# Patient Record
Sex: Female | Born: 1970 | Race: Black or African American | Hispanic: No | Smoking: Never smoker
Health system: Southern US, Community
[De-identification: ages and names within clinical notes are randomized; demographics above are authoritative.]

## PROBLEM LIST (undated history)

## (undated) DIAGNOSIS — L409 Psoriasis, unspecified: Secondary | ICD-10-CM

## (undated) DIAGNOSIS — T7840XA Allergy, unspecified, initial encounter: Secondary | ICD-10-CM

## (undated) DIAGNOSIS — O24419 Gestational diabetes mellitus in pregnancy, unspecified control: Secondary | ICD-10-CM

## (undated) HISTORY — DX: Gestational diabetes mellitus in pregnancy, unspecified control: O24.419

## (undated) HISTORY — DX: Psoriasis, unspecified: L40.9

## (undated) HISTORY — DX: Allergy, unspecified, initial encounter: T78.40XA

## (undated) HISTORY — PX: JOINT REPLACEMENT: SHX530

## (undated) HISTORY — PX: KNEE SURGERY: SHX244

---

## 1999-10-22 ENCOUNTER — Other Ambulatory Visit: Admission: RE | Admit: 1999-10-22 | Discharge: 1999-10-22 | Payer: Self-pay | Admitting: *Deleted

## 1999-11-05 ENCOUNTER — Encounter: Admission: RE | Admit: 1999-11-05 | Discharge: 2000-02-03 | Payer: Self-pay | Admitting: Gynecology

## 2000-05-10 ENCOUNTER — Inpatient Hospital Stay (HOSPITAL_COMMUNITY): Admission: AD | Admit: 2000-05-10 | Discharge: 2000-05-12 | Payer: Self-pay | Admitting: *Deleted

## 2000-06-21 ENCOUNTER — Other Ambulatory Visit: Admission: RE | Admit: 2000-06-21 | Discharge: 2000-06-21 | Payer: Self-pay | Admitting: *Deleted

## 2000-07-29 ENCOUNTER — Encounter: Admission: RE | Admit: 2000-07-29 | Discharge: 2000-08-17 | Payer: Self-pay | Admitting: Gynecology

## 2000-08-29 ENCOUNTER — Encounter: Admission: RE | Admit: 2000-08-29 | Discharge: 2000-09-28 | Payer: Self-pay | Admitting: Gynecology

## 2000-09-29 ENCOUNTER — Encounter: Admission: RE | Admit: 2000-09-29 | Discharge: 2000-10-29 | Payer: Self-pay | Admitting: Gynecology

## 2003-08-02 ENCOUNTER — Other Ambulatory Visit: Admission: RE | Admit: 2003-08-02 | Discharge: 2003-08-02 | Payer: Self-pay | Admitting: Gynecology

## 2004-08-05 ENCOUNTER — Other Ambulatory Visit: Admission: RE | Admit: 2004-08-05 | Discharge: 2004-08-05 | Payer: Self-pay | Admitting: Gynecology

## 2005-09-01 ENCOUNTER — Other Ambulatory Visit: Admission: RE | Admit: 2005-09-01 | Discharge: 2005-09-01 | Payer: Self-pay | Admitting: Gynecology

## 2006-03-10 ENCOUNTER — Emergency Department (HOSPITAL_COMMUNITY): Admission: EM | Admit: 2006-03-10 | Discharge: 2006-03-10 | Payer: Self-pay | Admitting: Emergency Medicine

## 2006-11-08 ENCOUNTER — Other Ambulatory Visit: Admission: RE | Admit: 2006-11-08 | Discharge: 2006-11-08 | Payer: Self-pay | Admitting: Gynecology

## 2007-05-19 ENCOUNTER — Encounter: Admission: RE | Admit: 2007-05-19 | Discharge: 2007-05-19 | Payer: Self-pay | Admitting: Family Medicine

## 2007-09-29 ENCOUNTER — Ambulatory Visit (HOSPITAL_BASED_OUTPATIENT_CLINIC_OR_DEPARTMENT_OTHER): Admission: RE | Admit: 2007-09-29 | Discharge: 2007-09-29 | Payer: Self-pay | Admitting: Orthopedic Surgery

## 2008-05-10 ENCOUNTER — Encounter: Admission: RE | Admit: 2008-05-10 | Discharge: 2008-05-10 | Payer: Self-pay | Admitting: Family Medicine

## 2008-05-23 ENCOUNTER — Ambulatory Visit: Payer: Self-pay | Admitting: Women's Health

## 2008-07-15 ENCOUNTER — Ambulatory Visit: Payer: Self-pay | Admitting: Family Medicine

## 2008-07-15 DIAGNOSIS — M1710 Unilateral primary osteoarthritis, unspecified knee: Secondary | ICD-10-CM

## 2008-07-15 DIAGNOSIS — IMO0002 Reserved for concepts with insufficient information to code with codable children: Secondary | ICD-10-CM | POA: Insufficient documentation

## 2008-07-25 ENCOUNTER — Telehealth (INDEPENDENT_AMBULATORY_CARE_PROVIDER_SITE_OTHER): Payer: Self-pay | Admitting: *Deleted

## 2008-07-25 ENCOUNTER — Encounter: Payer: Self-pay | Admitting: Sports Medicine

## 2008-08-13 ENCOUNTER — Ambulatory Visit: Payer: Self-pay | Admitting: Gynecology

## 2008-10-11 ENCOUNTER — Encounter: Payer: Self-pay | Admitting: Women's Health

## 2008-10-11 ENCOUNTER — Other Ambulatory Visit: Admission: RE | Admit: 2008-10-11 | Discharge: 2008-10-11 | Payer: Self-pay | Admitting: Gynecology

## 2008-10-11 ENCOUNTER — Ambulatory Visit: Payer: Self-pay | Admitting: Women's Health

## 2008-11-15 ENCOUNTER — Ambulatory Visit: Payer: Self-pay | Admitting: Women's Health

## 2009-01-01 ENCOUNTER — Inpatient Hospital Stay (HOSPITAL_COMMUNITY): Admission: RE | Admit: 2009-01-01 | Discharge: 2009-01-05 | Payer: Self-pay | Admitting: Orthopedic Surgery

## 2010-04-20 LAB — CARDIAC PANEL(CRET KIN+CKTOT+MB+TROPI)
CK, MB: 0.9 ng/mL (ref 0.3–4.0)
CK, MB: 1.2 ng/mL (ref 0.3–4.0)
Relative Index: 0.1 (ref 0.0–2.5)
Relative Index: 0.1 (ref 0.0–2.5)
Relative Index: 0.1 (ref 0.0–2.5)
Total CK: 1002 U/L — ABNORMAL HIGH (ref 7–177)
Total CK: 1111 U/L — ABNORMAL HIGH (ref 7–177)
Total CK: 718 U/L — ABNORMAL HIGH (ref 7–177)
Total CK: 881 U/L — ABNORMAL HIGH (ref 7–177)
Total CK: 961 U/L — ABNORMAL HIGH (ref 7–177)
Troponin I: 0.01 ng/mL (ref 0.00–0.06)
Troponin I: 0.01 ng/mL (ref 0.00–0.06)
Troponin I: 0.02 ng/mL (ref 0.00–0.06)
Troponin I: 0.02 ng/mL (ref 0.00–0.06)

## 2010-04-20 LAB — BASIC METABOLIC PANEL
BUN: 3 mg/dL — ABNORMAL LOW (ref 6–23)
BUN: 7 mg/dL (ref 6–23)
CO2: 25 mEq/L (ref 19–32)
Calcium: 7.8 mg/dL — ABNORMAL LOW (ref 8.4–10.5)
Calcium: 8.2 mg/dL — ABNORMAL LOW (ref 8.4–10.5)
Chloride: 105 mEq/L (ref 96–112)
Creatinine, Ser: 0.61 mg/dL (ref 0.4–1.2)
Creatinine, Ser: 0.64 mg/dL (ref 0.4–1.2)
Creatinine, Ser: 0.65 mg/dL (ref 0.4–1.2)
Creatinine, Ser: 0.66 mg/dL (ref 0.4–1.2)
GFR calc Af Amer: >60 mL/min (ref >60)
GFR calc Af Amer: >60 mL/min (ref >60)
GFR calc non Af Amer: >60 mL/min (ref >60)
GFR calc non Af Amer: >60 mL/min (ref >60)
GFR calc non Af Amer: >60 mL/min (ref >60)
Glucose, Bld: 97 mg/dL (ref 70–99)
Potassium: 4.3 mEq/L (ref 3.5–5.1)
Sodium: 134 mEq/L — ABNORMAL LOW (ref 135–145)
Sodium: 136 mEq/L (ref 135–145)
Sodium: 136 mEq/L (ref 135–145)
Sodium: 138 mEq/L (ref 135–145)

## 2010-04-20 LAB — CBC
Hemoglobin: 11 g/dL — ABNORMAL LOW (ref 12.0–15.0)
Hemoglobin: 11.2 g/dL — ABNORMAL LOW (ref 12.0–15.0)
MCHC: 34 g/dL (ref 30.0–36.0)
MCHC: 34.6 g/dL (ref 30.0–36.0)
MCHC: 35 g/dL (ref 30.0–36.0)
MCV: 90.4 fL (ref 78.0–100.0)
MCV: 91.8 fL (ref 78.0–100.0)
Platelets: 320 10*3/uL (ref 150–400)
RBC: 3.4 MIL/uL — ABNORMAL LOW (ref 3.87–5.11)
RBC: 3.48 MIL/uL — ABNORMAL LOW (ref 3.87–5.11)
RBC: 3.59 MIL/uL — ABNORMAL LOW (ref 3.87–5.11)
RDW: 12.8 % (ref 11.5–15.5)
WBC: 10.6 10*3/uL — ABNORMAL HIGH (ref 4.0–10.5)
WBC: 9.9 10*3/uL (ref 4.0–10.5)

## 2010-04-20 LAB — HEMOGLOBIN AND HEMATOCRIT, BLOOD
HCT: 34.6 % — ABNORMAL LOW (ref 36.0–46.0)
Hemoglobin: 11.9 g/dL — ABNORMAL LOW (ref 12.0–15.0)

## 2010-04-20 LAB — PROTIME-INR
INR: 1.72 — ABNORMAL HIGH (ref 0.00–1.49)
INR: 1.91 — ABNORMAL HIGH (ref 0.00–1.49)
INR: 2.09 — ABNORMAL HIGH (ref 0.00–1.49)
Prothrombin Time: 14 seconds (ref 11.6–15.2)
Prothrombin Time: 20 seconds — ABNORMAL HIGH (ref 11.6–15.2)
Prothrombin Time: 23.3 seconds — ABNORMAL HIGH (ref 11.6–15.2)

## 2010-04-21 LAB — COMPREHENSIVE METABOLIC PANEL
ALT: 31 U/L (ref 0–35)
Calcium: 9.3 mg/dL (ref 8.4–10.5)
GFR calc Af Amer: >60 mL/min (ref >60)
Glucose, Bld: 95 mg/dL (ref 70–99)
Sodium: 137 mEq/L (ref 135–145)
Total Protein: 7.2 g/dL (ref 6.0–8.3)

## 2010-04-21 LAB — URINALYSIS, ROUTINE W REFLEX MICROSCOPIC
Glucose, UA: NEGATIVE mg/dL
Protein, ur: NEGATIVE mg/dL
Urobilinogen, UA: 1 mg/dL (ref 0.0–1.0)

## 2010-04-21 LAB — CBC
HCT: 38 % (ref 36.0–46.0)
MCV: 90.9 fL (ref 78.0–100.0)
RBC: 4.18 MIL/uL (ref 3.87–5.11)
RDW: 12.9 % (ref 11.5–15.5)
WBC: 7.8 10*3/uL (ref 4.0–10.5)

## 2010-04-21 LAB — PROTIME-INR: Prothrombin Time: 13.2 seconds (ref 11.6–15.2)

## 2010-04-21 LAB — TYPE AND SCREEN: ABO/RH(D): O POS

## 2010-04-21 LAB — URINE MICROSCOPIC-ADD ON

## 2010-06-02 NOTE — Op Note (Signed)
Gabriella Lutz, Gabriella Lutz               ACCOUNT NO.:  [id number]   MEDICAL RECORD NO.:  [id number]          PATIENT TYPE:  AMB   LOCATION:  DSC                          FACILITY:  [facility]   PHYSICIAN:  Harvie Junior, M.D.   DATE OF BIRTH:  02/26/1970   DATE OF PROCEDURE:  09/29/2007  DATE OF DISCHARGE:                               OPERATIVE REPORT   PREOPERATIVE DIAGNOSIS:  Medial meniscal tear.   POSTOPERATIVE DIAGNOSES:  1. Medial meniscal tear.  2. Chondromalacia of the medial femoral condyle.  3. Chondromalacia of the patellofemoral joint.   PRINCIPAL PROCEDURE:  1. Partial posterior horn medial meniscectomy with corresponding      debridement in the medial compartment.  2. Chondroplasty of the patellofemoral joint.   SURGEON:  Harvie Junior, MD   ASSISTANT:  Marshia Ly, PA.   ANESTHESIA:  General.   BRIEF HISTORY:  [name] is a 40 year old female with a long history  of having had a knee pain for a prolonged period of time.  She was  treated conservatively for a period time.  Injection therapy and  antiinflammatory medication all failed.  MRI was obtained, which showed  she had some degenerative changes in the medial meniscus.  No obvious  tear.  There were some concerns over patellofemoral issues.  She was  ultimately brought to operating room for operative knee arthroscopy  after failure of all conservative care.   PROCEDURE:  The patient was brought to operating room.  After adequate  anesthesia was obtained with a general anesthetic, the patient was  placed supine on the operating room table.  The right leg was prepped  and draped in the usual sterile fashion.  Following this, routine  arthroscopic examination of the knee revealed there was an obvious  posterior horn medial meniscal tear.  This was debrided back to a smooth  and stable rim.  Attention was turned to the medial femoral condyle,  which had some grade 2 and may be with some early grade 3 changes.   This  was debrided back to a smooth and stable rim.  Attention was turned to  the ACL normal, lateral side normal.  Patellofemoral joint had some  grade 2 changes and the patellofemoral trochlear under the posterior  aspects of the  patella.  The knee at this point was copiously and thoroughly irrigated  and suctioned dry.  All sterile portals were closed with bandages and  sterile compression dressing was applied.  The patient was taken to the  recovery room.  She was noted to be in satisfactory condition.  Estimated blood loss during the procedure was none.      Harvie Junior, M.D.  Electronically Signed     JLG/MEDQ  D:  09/29/2007  T:  09/30/2007  Job:  [id number]

## 2010-06-05 NOTE — Discharge Summary (Signed)
[facility]  Patient:    Gabriella Lutz, Gabriella Lutz                      MRN: [id number] Adm. Date:  [id number] Disc. Date: [id number] Attending:  Merrily Pew Dictator:   Antony Contras, [facility]                           Discharge Summary  DISCHARGE DIAGNOSES:          1. Intrauterine pregnancy at term.                               2. History of gestational diabetes, diet                                  controlled.                               3. Positive group beta streptococcus.  PROCEDURES:                   Normal spontaneous vaginal delivery of a                               viable infant over intact perineum.  HISTORY OF PRESENT ILLNESS:   The patient is a 40 year old, gravida 3, para 1-0-1-1 with an EDC of May 13, 2000 as determined by sonogram. Prenatal risk factors included a history of gestational diabetes which was controlled by diet, and also a history of MSAFP which showed an elevated risk for Downs syndrome.  PRENATAL LABORATORY DATA:     Blood type O positive, antibody screen negative, sickle cell negative. RPR, HbsAg, and HIV nonreactive, and rubella immune. The patient also did have a normal amniocentesis, and positive GBS.  HOSPITAL COURSE AND TREATMENT:                The patient was admitted on May 10, 2000 with spontaneous onset of labor. Antibiotic prophylaxis was given. Cervical dilatation was 3 cm, 60%, and -2 station. She did progress rapidly to complete dilatation and delivered an Apgar 8/9 female infant weighing 8 pounds 10 ounces over an intact perineum.  Postpartum course she remained afebrile, she had no difficulty voiding, and was able to be discharged in satisfactory condition on her second postpartum day.  DISCHARGE LABORATORIES:       CBC: Hematocrit 31.5, hemoglobin 10.8, WBCs 11.7, platelets 329,000.  DISPOSITION:                  The patient is to follow up in six weeks.  DISCHARGE MEDICATIONS:         The patient is to continue prenatal vitamins and iron. Motrin and Tylox for pain. DD:  05/30/00 TD:  05/30/00 Job: [id number]

## 2010-10-21 LAB — POCT HEMOGLOBIN-HEMACUE: Hemoglobin: 12

## 2011-08-31 ENCOUNTER — Ambulatory Visit (INDEPENDENT_AMBULATORY_CARE_PROVIDER_SITE_OTHER): Payer: BC Managed Care – PPO | Admitting: Family Medicine

## 2011-08-31 VITALS — BP 118/67 | HR 79 | Temp 98.0°F | Resp 18 | Ht 68.5 in | Wt 260.0 lb

## 2011-08-31 DIAGNOSIS — Z111 Encounter for screening for respiratory tuberculosis: Secondary | ICD-10-CM

## 2011-08-31 DIAGNOSIS — Z0000 Encounter for general adult medical examination without abnormal findings: Secondary | ICD-10-CM

## 2011-08-31 DIAGNOSIS — Z23 Encounter for immunization: Secondary | ICD-10-CM

## 2011-08-31 DIAGNOSIS — Z0184 Encounter for antibody response examination: Secondary | ICD-10-CM

## 2011-08-31 DIAGNOSIS — E669 Obesity, unspecified: Secondary | ICD-10-CM

## 2011-08-31 DIAGNOSIS — D649 Anemia, unspecified: Secondary | ICD-10-CM

## 2011-08-31 LAB — CBC WITH DIFFERENTIAL/PLATELET
Eosinophils Absolute: 0.1 10*3/uL (ref 0.0–0.7)
Eosinophils Relative: 1 % (ref 0–5)
Hemoglobin: 11.9 g/dL — ABNORMAL LOW (ref 12.0–15.0)
Lymphocytes Relative: 53 % — ABNORMAL HIGH (ref 12–46)
Lymphs Abs: 3.7 10*3/uL (ref 0.7–4.0)
MCH: 28.6 pg (ref 26.0–34.0)
MCV: 87.3 fL (ref 78.0–100.0)
Monocytes Relative: 10 % (ref 3–12)
RBC: 4.16 MIL/uL (ref 3.87–5.11)

## 2011-08-31 LAB — COMPREHENSIVE METABOLIC PANEL
Albumin: 4.1 g/dL (ref 3.5–5.2)
Alkaline Phosphatase: 81 U/L (ref 39–117)
BUN: 9 mg/dL (ref 6–23)
CO2: 24 mEq/L (ref 19–32)
Calcium: 9.2 mg/dL (ref 8.4–10.5)
Glucose, Bld: 83 mg/dL (ref 70–99)
Potassium: 4.2 mEq/L (ref 3.5–5.3)

## 2011-08-31 LAB — POCT URINALYSIS DIPSTICK
Bilirubin, UA: NEGATIVE
Glucose, UA: NEGATIVE
Nitrite, UA: NEGATIVE

## 2011-08-31 LAB — POCT GLYCOSYLATED HEMOGLOBIN (HGB A1C): Hemoglobin A1C: 5.5

## 2011-08-31 LAB — POCT UA - MICROSCOPIC ONLY

## 2011-08-31 LAB — TSH: TSH: 1.311 u[IU]/mL (ref 0.350–4.500)

## 2011-08-31 LAB — LIPID PANEL
Cholesterol: 170 mg/dL (ref 0–200)
Triglycerides: 130 mg/dL (ref <150)
VLDL: 26 mg/dL (ref 0–40)

## 2011-08-31 NOTE — Patient Instructions (Addendum)
We performed a complete physical exam today. You received your 3rd Hepatitis B vaccine; you have completed the series.  We have also obtained labs and you will be contacted with results.  We have obtained immunity status for chicken pox/varicella.   Recommend weight loss. Also recommend follow-up with GYN for pap smears and mammogram and Mirena IUD replacement. Also need to return for Tb skin testing at our clinic or another clinic.  1. Annual physical exam  POCT urinalysis dipstick, POCT UA - Microscopic Only, CBC with Differential, Vitamin D 25 hydroxy, Vitamin B12, TSH, Lipid panel, Varicella zoster antibody, IgG, Comprehensive metabolic panel, POCT glycosylated hemoglobin (Hb A1C)  2. Screening-pulmonary TB  TB Skin Test  3. Need for hepatitis B vaccination  Hepatitis B vaccine adult IM  4. Immunity status testing  Varicella zoster antibody, IgG  5. Obesity

## 2011-08-31 NOTE — Progress Notes (Signed)
Subjective:    Patient ID: Gabriella Lutz, female    DOB: [birth year], 41 y.o.   MRN: [id number]  HPIThis 41 y.o. female presents for evaluation of CPE.  Last physical 08/2010.  Pap smear 08/2010 normal; history of abnormal pap smear 2008; Mirena placed by [facility] 2008.  Mammogram never.  Colonoscopy never.  Tetanus vaccine 07/2008.  Hepatitis B x 2 2010; needs 3rd Hepatitis B.  Influenza vaccine no.  Eye exam 01/2011; +contacts; no g/c.  Dental exam 08/2010.  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  Due for Mirena IUD replacement in 09/2011; plans to follow-up with GYN for pap smear, mammogram, Mirena replacement.   Review of Systems  Constitutional: Negative for fever, chills, diaphoresis, activity change, appetite change, fatigue and unexpected weight change.  HENT: Positive for congestion and sinus pressure. Negative for hearing loss, ear pain, sore throat, rhinorrhea, sneezing, mouth sores, trouble swallowing, neck stiffness, voice change, postnasal drip and ear discharge.   Eyes: Negative for pain, discharge, redness and visual disturbance.  Respiratory: Negative for apnea, cough, choking, chest tightness, shortness of breath and wheezing.   Cardiovascular: Negative for chest pain, palpitations and leg swelling.  Gastrointestinal: Negative for nausea, vomiting, abdominal pain, diarrhea, constipation, blood in stool and abdominal distention.  Genitourinary: Negative for frequency, hematuria, flank pain, vaginal bleeding, vaginal discharge, enuresis and dyspareunia.  Musculoskeletal: Positive for arthralgias. Negative for myalgias, back pain, joint swelling and gait problem.  Skin: Positive for rash.  Neurological: Negative for dizziness, tremors, syncope, weakness, light-headedness, numbness and headaches.  Hematological: Negative for adenopathy. Does not  bruise/bleed easily.  Psychiatric/Behavioral: Positive for disturbed wake/sleep cycle and decreased concentration. Negative for suicidal ideas, hallucinations, behavioral problems, confusion, self-injury, dysphoric mood and agitation. The patient is nervous/anxious. The patient is not hyperactive.        Objective:   Physical Exam  Constitutional: She is oriented to person, place, and time. She appears well-developed and well-nourished. No distress.  HENT:  Head: Normocephalic and atraumatic.  Right Ear: External ear normal.  Left Ear: External ear normal.  Nose: Nose normal.  Mouth/Throat: Oropharynx is clear and moist. No oropharyngeal exudate.  Eyes: Conjunctivae and EOM are normal. Pupils are equal, round, and reactive to light.  Neck: Normal range of motion. Neck supple. No thyromegaly present.  Cardiovascular: Normal rate, regular rhythm, normal heart sounds and intact distal pulses.  Exam reveals no gallop and no friction rub.   No murmur heard. Pulmonary/Chest: Effort normal and breath sounds normal. No respiratory distress. She has no wheezes. She has no rales.  Abdominal: Soft. Bowel sounds are normal. She exhibits no distension and no mass. There is no tenderness. There is no rebound and no guarding.  Musculoskeletal: Normal range of motion.  Lymphadenopathy:    She has no cervical adenopathy.  Neurological: She is alert and oriented to person, place, and time. She has normal reflexes. No cranial nerve deficit. Coordination normal.  Skin: Skin is warm and dry. Rash noted. She is not diaphoretic.       Mild scaling rash facial.  Psychiatric: She has a normal mood and affect. Her behavior is normal. Judgment and thought content normal.     Results for orders placed in visit on 08/31/11  POCT URINALYSIS DIPSTICK      Component Value Range   Color, UA yellow     Clarity, UA sl cloudy  Glucose, UA neg     Bilirubin, UA neg     Ketones, UA neg     Spec Grav, UA 1.015       Blood, UA neg     pH, UA 6.0     Protein, UA neg     Urobilinogen, UA 0.2     Nitrite, UA neg     Leukocytes, UA Trace    POCT UA - MICROSCOPIC ONLY      Component Value Range   WBC, Ur, HPF, POC 2-5     RBC, urine, microscopic 0-1     Bacteria, U Microscopic 1+     Mucus, UA neg     Epithelial cells, urine per micros 3-7     Crystals, Ur, HPF, POC neg     Casts, Ur, LPF, POC neg     Yeast, UA neg    POCT GLYCOSYLATED HEMOGLOBIN (HGB A1C)      Component Value Range   Hemoglobin A1C 5.5         Assessment & Plan:   1. Annual physical exam  POCT urinalysis dipstick, POCT UA - Microscopic Only, CBC with Differential, Vitamin D 25 hydroxy, Vitamin B12, TSH, Lipid panel, Varicella zoster antibody, IgG, Comprehensive metabolic panel, POCT glycosylated hemoglobin (Hb A1C)  2. Screening-pulmonary TB  TB Skin Test  3. Need for hepatitis B vaccination  Hepatitis B vaccine adult IM  4. Immunity status testing  Varicella zoster antibody, IgG  5. Obesity     1.  CPE: anticipatory guidance --- weight loss, exercise.  Immunizations UTD; s/p Hepatitis B#3 in office; recommend annual flu vaccine.  GYN care per gynecology; to call for appointment.  Warrants mammogram.  Obtain labs.  Recent STD screening negative per patient. 2.  Tb skin testing: pt to return in upcoming week for PPD. 3.  S/p Hepatitis B#3. 4.  Varicella titer obtianed. 5.  Obesity:  Recommend weight loss and exercise; history of gestational diabetes indicates high risk of developing DMII.  Pt expressed understanding.

## 2011-09-01 LAB — VITAMIN D 25 HYDROXY (VIT D DEFICIENCY, FRACTURES): Vit D, 25-Hydroxy: 19 ng/mL — ABNORMAL LOW (ref 30–89)

## 2011-09-06 ENCOUNTER — Ambulatory Visit (INDEPENDENT_AMBULATORY_CARE_PROVIDER_SITE_OTHER): Payer: BC Managed Care – PPO | Admitting: Physician Assistant

## 2011-09-06 VITALS — BP 127/67 | HR 82 | Temp 98.0°F | Resp 18 | Ht 67.6 in | Wt 261.2 lb

## 2011-09-06 DIAGNOSIS — Z111 Encounter for screening for respiratory tuberculosis: Secondary | ICD-10-CM

## 2011-09-06 NOTE — Progress Notes (Signed)
Patient ID: ELDENE PLOCHER MRN: [id number], DOB: [birth year], 41 y.o. Date of Encounter: 09/06/2011, 7:48 PM  Primary Physician: No primary provider on file.  Chief Complaint: PPD placement  HPI: 41 y.o. year old female here for PPD. Had CPE on 08/31/11 with Dr. Katrinka Blazing, but could not have a PPD done at that time due to having to travel out of town. Here now for PPD and form completion based on CPE from 08/31/11. No prior positive PPD. Asymptomatic. Needs for school.    Past Medical History  Diagnosis Date  . Gestational diabetes   . Allergy   . Psoriasis      Home Meds: Prior to Admission medications   Medication Sig Start Date End Date Taking? Authorizing Provider  levonorgestrel (MIRENA) 20 MCG/24HR IUD 1 each by Intrauterine route once.   Yes Historical Provider, MD  traMADol (ULTRAM) 50 MG tablet Take 1-2 by mouth every 8 hours as needed for knee pain.     Historical Provider, MD    Allergies:  Allergies  Allergen Reactions  . Nsaids Hives  . Vicodin (Hydrocodone-Acetaminophen) Other (See Comments)    hallucinations    History   Social History  . Marital Status: Legally Separated    Spouse Name: N/A    Number of Children: N/A  . Years of Education: N/A   Occupational History  . Not on file.   Social History Main Topics  . Smoking status: Never Smoker   . Smokeless tobacco: Never Used  . Alcohol Use: No  . Drug Use: No  . Sexually Active: Yes    Birth Control/ Protection: IUD     dating same partner x 1 year; happy; no abuse; history of Chlamydia.   Other Topics Concern  . Not on file   Social History Narrative   Marital Status:  Single; dating x 1 year; happy; no domestic violence.Person living in home: with 2 daughters.Work: full time studentChildren: 2 daughters; no grandchildren.Exercise: aerobic classes twice weekly.Tobacco : noneAlcohol: noneDrugs: none.     Review of Systems: Constitutional: negative for chills, fever, night sweats, weight  changes, or fatigue  HEENT: negative for vision changes, hearing loss, or epistaxis Cardiovascular: negative for chest pain or palpitations Respiratory: negative for hemoptysis, wheezing, shortness of breath, or cough Abdominal: negative for abdominal pain, nausea, vomiting, diarrhea, or constipation Dermatological: negative for rash Neurologic: negative for headache, dizziness, or syncope All other systems reviewed and are otherwise negative with the exception to those above and in the HPI.   Physical Exam: Blood pressure 127/67, pulse 82, temperature 98 F (36.7 C), temperature source Oral, resp. rate 18, height 5' 7.6" (1.717 m), weight 261 lb 3.2 oz (118.48 kg), last menstrual period 08/24/2011, SpO2 100.00%., Body mass index is 40.19 kg/(m^2). General: Well developed, well nourished, in no acute distress. Head: Normocephalic, atraumatic, eyes without discharge, sclera non-icteric, nares are without discharge.   Neck: Supple. No thyromegaly. Full ROM. No lymphadenopathy. Lungs: Clear bilaterally to auscultation without wheezes, rales, or rhonchi. Breathing is unlabored. Heart: RRR with S1 S2. No murmurs, rubs, or gallops appreciated. Msk:  Strength and tone normal for age. Extremities/Skin: Warm and dry. No clubbing or cyanosis. No edema. No rashes or suspicious lesions. Neuro: Alert and oriented X 3. Moves all extremities spontaneously. Gait is normal. CNII-XII grossly in tact. Psych:  Responds to questions appropriately with a normal affect.     ASSESSMENT AND PLAN:  41 y.o. year old female here for PPD. -PPD placed -RTC 48-72 hours  for reading -Form completed based on CPE on 08/31/11, pending PPD and signature  Signed, Eula Listen, PA-C 09/06/2011 7:48 PM

## 2011-09-08 ENCOUNTER — Ambulatory Visit (INDEPENDENT_AMBULATORY_CARE_PROVIDER_SITE_OTHER): Payer: BC Managed Care – PPO | Admitting: Physician Assistant

## 2011-09-08 DIAGNOSIS — Z111 Encounter for screening for respiratory tuberculosis: Secondary | ICD-10-CM

## 2011-09-08 LAB — TB SKIN TEST
Induration: 0 mm
TB Skin Test: NEGATIVE

## 2011-09-10 NOTE — Progress Notes (Signed)
Reviewed and agree.

## 2011-09-11 NOTE — Addendum Note (Signed)
Addended by: Johnnette Litter on: 09/11/2011 09:26 AM   Modules accepted: Orders

## 2011-10-04 IMAGING — CR DG KNEE 1-2V PORT*R*
2 series · 2 of 2 positions shown · non-contrast
Comparison: None

CLINICAL DATA: Knee osteoarthritis.  Status post knee arthroplasty.

PORTABLE RIGHT KNEE - 1-2 VIEW

[view not recorded (1 of 2)]
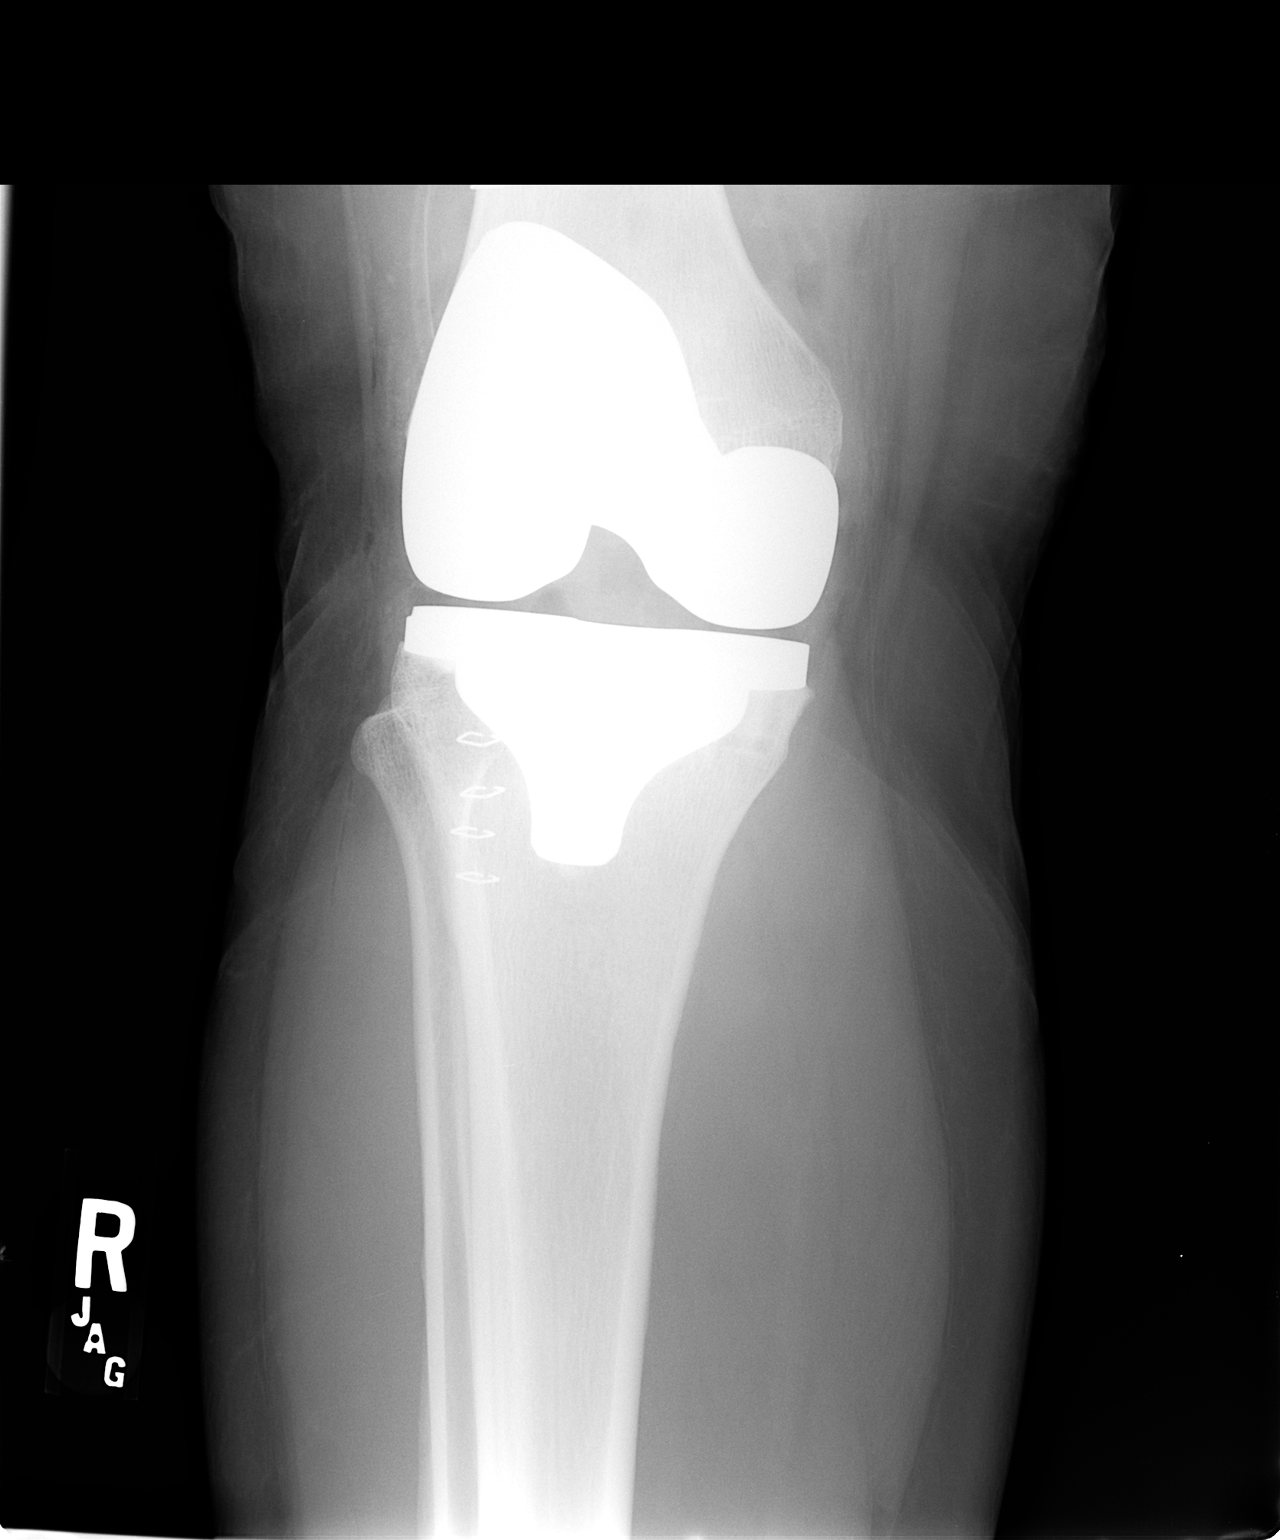

[view not recorded (2 of 2)]
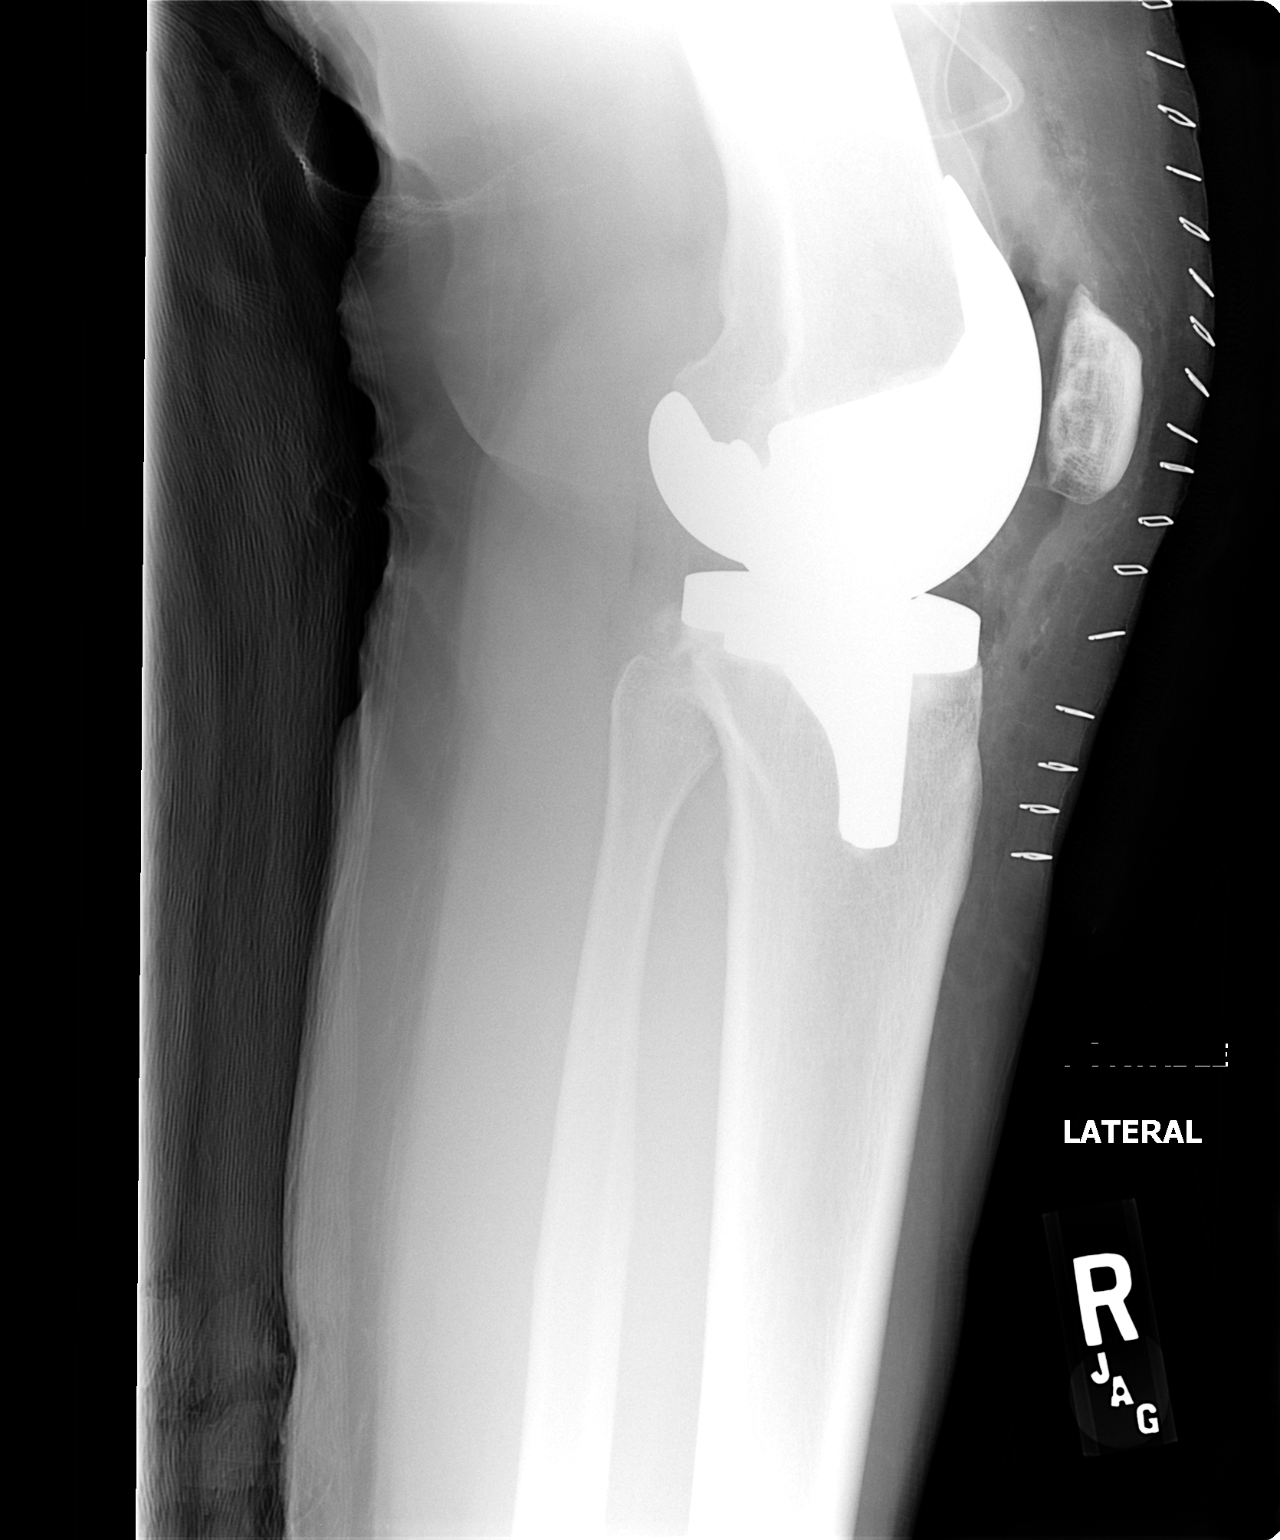

[2 of 2 positions shown; findings below may reference images not displayed]

FINDINGS: Total knee arthroplasty is seen with all three components
in expected position.  There is no evidence of fracture or
dislocation.  Surgical drain and skin staples noted.
IMPRESSION: Expected postoperative appearance of right knee arthroplasty.  No
evidence of fracture or dislocation.

## 2011-11-02 ENCOUNTER — Telehealth: Payer: Self-pay | Admitting: Gynecology

## 2011-11-02 NOTE — Telephone Encounter (Signed)
I spoke with patient today to inform her that the removal of her old IUD and the insertion of a new Mirena IUD is covered at 100% per her Gabriella Lutz [facility] insurance. She has an appt already for 11/09/11.WL

## 2011-11-03 ENCOUNTER — Other Ambulatory Visit: Payer: Self-pay | Admitting: Gynecology

## 2011-11-03 DIAGNOSIS — Z3049 Encounter for surveillance of other contraceptives: Secondary | ICD-10-CM

## 2011-11-03 MED ORDER — LEVONORGESTREL 20 MCG/24HR IU IUD: INTRAUTERINE_SYSTEM | Freq: Once | INTRAUTERINE | Status: DC

## 2011-11-04 ENCOUNTER — Ambulatory Visit (INDEPENDENT_AMBULATORY_CARE_PROVIDER_SITE_OTHER): Payer: BC Managed Care – PPO | Admitting: Emergency Medicine

## 2011-11-04 VITALS — BP 118/74 | HR 114 | Temp 98.5°F | Resp 20 | Ht 67.25 in | Wt 255.6 lb

## 2011-11-04 DIAGNOSIS — J309 Allergic rhinitis, unspecified: Secondary | ICD-10-CM

## 2011-11-04 DIAGNOSIS — J018 Other acute sinusitis: Secondary | ICD-10-CM

## 2011-11-04 DIAGNOSIS — J302 Other seasonal allergic rhinitis: Secondary | ICD-10-CM

## 2011-11-04 MED ORDER — PSEUDOEPHEDRINE-GUAIFENESIN ER 60-600 MG PO TB12: 1.0000 | ORAL_TABLET | Freq: Two times a day (BID) | ORAL | Status: DC

## 2011-11-04 MED ORDER — MOMETASONE FUROATE 50 MCG/ACT NA SUSP: 2.0000 | Freq: Every day | NASAL | Status: DC

## 2011-11-04 MED ORDER — AMOXICILLIN-POT CLAVULANATE 875-125 MG PO TABS: 1.0000 | ORAL_TABLET | Freq: Two times a day (BID) | ORAL | Status: DC

## 2011-11-04 NOTE — Progress Notes (Signed)
[facility] [address], [place] [address] [phone]  Date:  11/04/2011   Name:  Gabriella Lutz   DOB:  02-May-1970   MRN:  [id number]  PCP:  No primary provider on file.    Chief Complaint: Sinusitis   History of Present Illness:  Gabriella Lutz is a 41 y.o. very pleasant female patient who presents with the following:  Ill with recurrent sinus infections since March, June and August.  Now again has nasal congestion and pressure in cheeks.  No nasal drainage.  Has post nasal drip.  Cough that is only occasionally productive.  No fever but has chills.  Headache, myalgias, and arthralgias.  Denies wheezing or shortness of breath.  Patient Active Problem List  Diagnosis  . DEGENERATIVE JOINT DISEASE, KNEE    Past Medical History  Diagnosis Date  . Gestational diabetes   . Allergy   . Psoriasis     Past Surgical History  Procedure Date  . Joint replacement     History  Substance Use Topics  . Smoking status: Never Smoker   . Smokeless tobacco: Never Used  . Alcohol Use: No    Family History  Problem Relation Age of Onset  . Hypertension Mother   . Arthritis Mother   . Diabetes Father   . Arthritis Father     Allergies  Allergen Reactions  . Nsaids Hives  . Vicodin (Hydrocodone-Acetaminophen) Other (See Comments)    hallucinations    Medication list has been reviewed and updated.  Current Outpatient Prescriptions on File Prior to Visit  Medication Sig Dispense Refill  . levonorgestrel (MIRENA) 20 MCG/24HR IUD 1 each by Intrauterine route once.      . traMADol (ULTRAM) 50 MG tablet Take 1-2 by mouth every 8 hours as needed for knee pain.         Review of Systems:  As per HPI, otherwise negative.    Physical Examination: Filed Vitals:   11/04/11 1419  BP: 118/74  Pulse: 114  Temp: 98.5 F (36.9 C)  Resp: 20   Filed Vitals:   11/04/11 1419  Height: 5' 7.25" (1.708 m)  Weight: 255 lb 9.6 oz (115.939 kg)    Body mass index is 39.74 kg/(m^2). Ideal Body Weight: Weight in (lb) to have BMI = 25: 160.5   GEN: WDWN, NAD, Non-toxic, A & O x 3 HEENT: Atraumatic, Normocephalic. Neck supple. No masses, No LAD. Ears and Nose: No external deformity. CV: RRR, No M/G/R. No JVD. No thrill. No extra heart sounds. PULM: CTA B, no wheezes, crackles, rhonchi. No retractions. No resp. distress. No accessory muscle use. ABD: S, NT, ND, +BS. No rebound. No HSM. EXTR: No c/c/e NEURO Normal gait.  PSYCH: Normally interactive. Conversant. Not depressed or anxious appearing.  Calm demeanor.    Assessment and Plan: Sinusitis augmentin mucinex d Seasonal allergic rhinitis nasonex  Carmelina Dane, MD  I have reviewed and agree with documentation. Robert P. Merla Riches, M.D.

## 2011-11-09 ENCOUNTER — Ambulatory Visit (INDEPENDENT_AMBULATORY_CARE_PROVIDER_SITE_OTHER): Payer: BC Managed Care – PPO | Admitting: Gynecology

## 2011-11-09 ENCOUNTER — Encounter: Payer: Self-pay | Admitting: Gynecology

## 2011-11-09 VITALS — BP 118/78

## 2011-11-09 DIAGNOSIS — Z30433 Encounter for removal and reinsertion of intrauterine contraceptive device: Secondary | ICD-10-CM

## 2011-11-09 NOTE — Progress Notes (Signed)
Patient is a 41 year old with 2 children who presented to the office today to remove her overdue Mirena IUD that was placed 5 years ago and to replace it with a new one. Patient has not been seen the office over 3-1/2 years and is overdue for her annual gynecological examination. She is in a steady monogamous relationship denies any recent STDs and is asymptomatic today. Patient previously read the information she is fully where this form of contraception is 99% effective and is good for 5 years.  Exam: Bartholin urethra Skene glands: Within normal limits Vagina: No lesions or discharge Cervix: No lesions or discharge Uterus: Anteverted normal size shape and consistency Adnexa: No palpable masses or tenderness Rectal exam not done  Procedure note: The cervix was cleansed with Betadine solution. The IUD string was visualized and was grasped with a Bozeman clamp and retrieved shown to the patient and discarded. A single-tooth tenaculum was then placed on the anterior cervical lip and the uterus sounded to 8 cm. The IUD was inserted in a sterile fashion and the string was trimmed.  Patient will return back to the office in one month for followup and for her annual exam and Pap smear. Patient was given an Aleve for discomfort in the office today.

## 2011-11-09 NOTE — Patient Instructions (Addendum)
Intrauterine Device Information  An intrauterine device (IUD) is inserted into your uterus and prevents pregnancy. There are 2 types of IUDs available:  · Copper IUD. This type of IUD is wrapped in copper wire and is placed inside the uterus. Copper makes the uterus and fallopian tubes produce a fluid that kills sperm. The copper IUD can stay in place for 10 years.  · Hormone IUD. This type of IUD contains the hormone progestin (synthetic progesterone). The hormone thickens the cervical mucus and prevents sperm from entering the uterus, and it also thins the uterine lining to prevent implantation of a fertilized egg. The hormone can weaken or kill the sperm that get into the uterus. The hormone IUD can stay in place for 5 years.  Your caregiver will make sure you are a good candidate for a contraceptive IUD. Discuss with your caregiver the possible side effects.  ADVANTAGES  · It is highly effective, reversible, long-acting, and low maintenance.  · There are no estrogen-related side effects.  · An IUD can be used when breastfeeding.  · It is not associated with weight gain.  · It works immediately after insertion.  · The copper IUD does not interfere with your female hormones.  · The progesterone IUD can make heavy menstrual periods lighter.  · The progesterone IUD can be used for 5 years.  · The copper IUD can be used for 10 years.  DISADVANTAGES  · The progesterone IUD can be associated with irregular bleeding patterns.  · The copper IUD can make your menstrual flow heavier and more painful.  · You may experience cramping and vaginal bleeding after insertion.  Document Released: 12/09/2003 Document Revised: 03/29/2011 Document Reviewed: 05/09/2010  [employer]® Patient Information ©2013 [employer].

## 2011-11-10 ENCOUNTER — Encounter: Payer: Self-pay | Admitting: Gynecology

## 2011-11-11 NOTE — Progress Notes (Signed)
TB reading  

## 2011-12-10 ENCOUNTER — Encounter: Payer: BC Managed Care – PPO | Admitting: Gynecology

## 2011-12-21 ENCOUNTER — Encounter: Payer: Self-pay | Admitting: Gynecology

## 2011-12-21 ENCOUNTER — Other Ambulatory Visit (HOSPITAL_COMMUNITY)
Admission: RE | Admit: 2011-12-21 | Discharge: 2011-12-21 | Disposition: A | Discharge status: Home / Self Care | Payer: BC Managed Care – PPO | Source: Ambulatory Visit | Attending: Gynecology | Admitting: Gynecology

## 2011-12-21 ENCOUNTER — Ambulatory Visit (INDEPENDENT_AMBULATORY_CARE_PROVIDER_SITE_OTHER): Payer: BC Managed Care – PPO | Admitting: Gynecology

## 2011-12-21 VITALS — BP 126/82 | Ht 67.0 in | Wt 258.0 lb

## 2011-12-21 DIAGNOSIS — R635 Abnormal weight gain: Secondary | ICD-10-CM

## 2011-12-21 DIAGNOSIS — Z01419 Encounter for gynecological examination (general) (routine) without abnormal findings: Secondary | ICD-10-CM

## 2011-12-21 DIAGNOSIS — Z1151 Encounter for screening for human papillomavirus (HPV): Secondary | ICD-10-CM | POA: Insufficient documentation

## 2011-12-21 DIAGNOSIS — A599 Trichomoniasis, unspecified: Secondary | ICD-10-CM | POA: Insufficient documentation

## 2011-12-21 DIAGNOSIS — Z113 Encounter for screening for infections with a predominantly sexual mode of transmission: Secondary | ICD-10-CM

## 2011-12-21 DIAGNOSIS — E559 Vitamin D deficiency, unspecified: Secondary | ICD-10-CM

## 2011-12-21 DIAGNOSIS — N898 Other specified noninflammatory disorders of vagina: Secondary | ICD-10-CM

## 2011-12-21 DIAGNOSIS — N949 Unspecified condition associated with female genital organs and menstrual cycle: Secondary | ICD-10-CM

## 2011-12-21 LAB — HIV ANTIBODY (ROUTINE TESTING W REFLEX): HIV: NONREACTIVE

## 2011-12-21 LAB — WET PREP FOR TRICH, YEAST, CLUE

## 2011-12-21 LAB — RPR

## 2011-12-21 LAB — HEPATITIS B SURFACE ANTIGEN: Hepatitis B Surface Ag: NEGATIVE

## 2011-12-21 MED ORDER — METRONIDAZOLE 500 MG PO TABS: 500.0000 mg | ORAL_TABLET | Freq: Two times a day (BID) | ORAL | Status: DC

## 2011-12-21 MED ORDER — VITAMIN D (ERGOCALCIFEROL) 1.25 MG (50000 UNIT) PO CAPS: ORAL_CAPSULE | ORAL | Status: DC

## 2011-12-21 NOTE — Progress Notes (Signed)
Gabriella Lutz 07/27/1970 [id number]   History:    41 y.o.  for annual gyn exam with complaint of a vaginal odor for past few days. Patient on October 2 thousand 13 have her Mirena IUD changed. Her primary care physician Dr. Katrinka Blazing has done all her lab work recently. Upon review of her labs it appears her vitamin D level was low at 19. She's currently not taking any calcium or vitamin D. Her hemoglobin was noted to be 11.9. Patient's last Pap smear was 2010 which was normal. Patient denied any prior history of abnormal Pap smears. Patient with no prior mammograms.  Past medical history,surgical history, family history and social history were all reviewed and documented in the EPIC chart.  Gynecologic History No LMP recorded. Patient is not currently having periods (Reason: IUD). Contraception: IUD Last Pap: 2010. Results were: normal Last mammogram: No prior study. Results were: No prior study  Obstetric History OB History    Grav Para Term Preterm Abortions TAB SAB Ect Mult Living   2 2 2       2      # Outc Date GA Lbr Len/2nd Wgt Sex Del Anes PTL Lv   1 TRM     F SVD  No Yes   2 TRM     F SVD  No Yes       ROS: A ROS was performed and pertinent positives and negatives are included in the history.  GENERAL: No fevers or chills. HEENT: No change in vision, no earache, sore throat or sinus congestion. NECK: No pain or stiffness. CARDIOVASCULAR: No chest pain or pressure. No palpitations. PULMONARY: No shortness of breath, cough or wheeze. GASTROINTESTINAL: No abdominal pain, nausea, vomiting or diarrhea, melena or bright red blood per rectum. GENITOURINARY: No urinary frequency, urgency, hesitancy or dysuria. MUSCULOSKELETAL: No joint or muscle pain, no back pain, no recent trauma. DERMATOLOGIC: No rash, no itching, no lesions. ENDOCRINE: No polyuria, polydipsia, no heat or cold intolerance. No recent change in weight. HEMATOLOGICAL: No anemia or easy bruising or bleeding. NEUROLOGIC: No  headache, seizures, numbness, tingling or weakness. PSYCHIATRIC: No depression, no loss of interest in normal activity or change in sleep pattern.     Exam: chaperone present  BP 126/82  Ht 5\' 7"  (1.702 m)  Wt 258 lb (117.028 kg)  BMI 40.41 kg/m2  Body mass index is 40.41 kg/(m^2).  General appearance : Well developed well nourished female. No acute distress HEENT: Neck supple, trachea midline, no carotid bruits, no thyroidmegaly Lungs: Clear to auscultation, no rhonchi or wheezes, or rib retractions  Heart: Regular rate and rhythm, no murmurs or gallops Breast:Examined in sitting and supine position were symmetrical in appearance, no palpable masses or tenderness,  no skin retraction, no nipple inversion, no nipple discharge, no skin discoloration, no axillary or supraclavicular lymphadenopathy Abdomen: no palpable masses or tenderness, no rebound or guarding Extremities: no edema or skin discoloration or tenderness  Pelvic:  Bartholin, Urethra, Skene Glands: Within normal limits             Vagina: Brownish foul-smelling discharge  Cervix: Erythematous, IUD string seen  Uterus  anteverted, normal size, shape and consistency, non-tender and mobile  Adnexa  Without masses or tenderness  Anus and perineum  normal   Rectovaginal  normal sphincter tone without palpated masses or tenderness             Hemoccult not indicated   Wet prep: Pos Amine, Pos Trich, moderate clue cells, moderate  white blood cell, too numerous to count bacteria  Assessment/Plan:  41 y.o. female for annual exam with clinical evidence of trichomoniasis/BV. Patient will be treated with Flagyl 500 mg one by mouth twice a day for 5 days. GC and Chlamydia culture was obtained today as well as HIV, RPR, hepatitis B, hepatitis C. She will return back next month for test of cure. Her partner next to seek medical attention for treatment. For vitamin D deficiency she will be started on vitamin D 50,000 units q. weekly for  12 weeks and to return to the office for followup vitamin D level check. When she finishes a 12 week treatment I have asked her to start taking vitamin D 3 cholecalciferol 2000 units daily. She was reminded to start taking one iron tablet daily. Next month will check her CBC as well. Literature information on the above was provided. Pap smear was done today as well.    Ok Edwards MD, 12:51 PM 12/21/2011

## 2011-12-21 NOTE — Patient Instructions (Addendum)
Vitamin D Deficiency Vitamin D is an important vitamin that your body needs. Having too little of it in your body is called a deficiency. A very bad deficiency can make your bones soft and can cause a condition called rickets.  Vitamin D is important to your body for different reasons, such as:   It helps your body absorb 2 minerals called calcium and phosphorus.  It helps make your bones healthy.  It may prevent some diseases, such as diabetes and multiple sclerosis.  It helps your muscles and heart. You can get vitamin D in several ways. It is a natural part of some foods. The vitamin is also added to some dairy products and cereals. Some people take vitamin D supplements. Also, your body makes vitamin D when you are in the sun. It changes the sun's rays into a form of the vitamin that your body can use. CAUSES   Not eating enough foods that contain vitamin D.  Not getting enough sunlight.  Having certain digestive system diseases that make it hard to absorb vitamin D. These diseases include Crohn's disease, chronic pancreatitis, and cystic fibrosis.  Having a surgery in which part of the stomach or small intestine is removed.  Being obese. Fat cells pull vitamin D out of your blood. That means that obese people may not have enough vitamin D left in their blood and in other body tissues.  Having chronic kidney or liver disease. RISK FACTORS Risk factors are things that make you more likely to develop a vitamin D deficiency. They include:  Being older.  Not being able to get outside very much.  Living in a nursing home.  Having had broken bones.  Having weak or thin bones (osteoporosis).  Having a disease or condition that changes how your body absorbs vitamin D.  Having dark skin.  Some medicines such as seizure medicines or steroids.  Being overweight or obese. SYMPTOMS Mild cases of vitamin D deficiency may not have any symptoms. If you have a very bad case, symptoms  may include:  Bone pain.  Muscle pain.  Falling often.  Broken bones caused by a minor injury, due to osteoporosis. DIAGNOSIS A blood test is the best way to tell if you have a vitamin D deficiency. TREATMENT Vitamin D deficiency can be treated in different ways. Treatment for vitamin D deficiency depends on what is causing it. Options include:  Taking vitamin D supplements.  Taking a calcium supplement. Your caregiver will suggest what dose is best for you. HOME CARE INSTRUCTIONS  Take any supplements that your caregiver prescribes. Follow the directions carefully. Take only the suggested amount.  Have your blood tested 2 months after you start taking supplements.  Eat foods that contain vitamin D. Healthy choices include:  Fortified dairy products, cereals, or juices. Fortified means vitamin D has been added to the food. Check the label on the package to be sure.  Fatty fish like salmon or trout.  Eggs.  Oysters.  Spend some time in the sun. Most people should get out in the sun without sunblock for 10 to 15 minutes at a time. Do this 3 times a week. People who have had skin cancer should not do this. Ask your caregiver how long you should be in the sun. Do not use a tanning bed.  Keep your weight at a healthy level. Lose weight if you need to.  Keep all follow-up appointments. Your caregiver will need to perform blood tests to make sure your  vitamin D deficiency is going away. SEEK MEDICAL CARE IF:  You have any questions about your treatment.  You continue to have symptoms of vitamin D deficiency.  You have nausea or vomiting.  You are constipated.  You feel confused.  You have severe abdominal or back pain. MAKE SURE YOU:  Understand these instructions.  Will watch your condition.  Will get help right away if you are not doing well or get worse. Document Released: 03/29/2011 Document Reviewed: 03/29/2011 [facility] Patient Information 2013 [place],  [place]. Trichomoniasis Trichomoniasis is an infection, caused by the Trichomonas organism, that affects both women and men. In women, the outer female genitalia and the vagina are affected. In men, the penis is mainly affected, but the prostate and other reproductive organs can also be involved. Trichomoniasis is a sexually transmitted disease (STD) and is most often passed to another person through sexual contact. The majority of people who get trichomoniasis do so from a sexual encounter and are also at risk for other STDs. CAUSES  Sexual intercourse with an infected partner. It can be present in swimming pools or hot tubs. SYMPTOMS  Abnormal gray-green frothy vaginal discharge in women. Vaginal itching and irritation in women. Itching and irritation of the area outside the vagina in women. Penile discharge with or without pain in males. Inflammation of the urethra (urethritis), causing painful urination. Bleeding after sexual intercourse. RELATED COMPLICATIONS Pelvic inflammatory disease. Infection of the uterus (endometritis). Infertility. Tubal (ectopic) pregnancy. It can be associated with other STDs, including gonorrhea and chlamydia, hepatitis B, and HIV. COMPLICATIONS DURING PREGNANCY Early (premature) delivery. Premature rupture of the membranes (PROM). Low birth weight. DIAGNOSIS  Visualization of Trichomonas under the microscope from the vagina discharge. Ph of the vagina greater than 4.5, tested with a test tape. Trich Rapid Test. Culture of the organism, but this is not usually needed. It may be found on a Pap test. Having a "strawberry cervix,"which means the cervix looks very red like a strawberry. TREATMENT  You may be given medication to fight the infection. Inform your caregiver if you could be or are pregnant. Some medications used to treat the infection should not be taken during pregnancy. Over-the-counter medications or creams to decrease itching or irritation may  be recommended. Your sexual partner will need to be treated if infected. HOME CARE INSTRUCTIONS  Take all medication prescribed by your caregiver. Take over-the-counter medication for itching or irritation as directed by your caregiver. Do not have sexual intercourse while you have the infection. Do not douche or wear tampons. Discuss your infection with your partner, as your partner may have acquired the infection from you. Or, your partner may have been the person who transmitted the infection to you. Have your sex partner examined and treated if necessary. Practice safe, informed, and protected sex. See your caregiver for other STD testing. SEEK MEDICAL CARE IF:  You still have symptoms after you finish the medication. You have an oral temperature above 102 F (38.9 C). You develop belly (abdominal) pain. You have pain when you urinate. You have bleeding after sexual intercourse. You develop a rash. The medication makes you sick or makes you throw up (vomit). Document Released: 06/30/2000 Document Revised: 03/29/2011 Document Reviewed: 07/26/2008 [facility] Patient Information 2013 [place], [place].

## 2011-12-21 NOTE — Addendum Note (Signed)
Addended by: Bertram Savin A on: 12/21/2011 02:51 PM   Modules accepted: Orders

## 2011-12-22 LAB — GC/CHLAMYDIA PROBE AMP
CT Probe RNA: NEGATIVE
GC Probe RNA: NEGATIVE

## 2012-01-04 ENCOUNTER — Ambulatory Visit (INDEPENDENT_AMBULATORY_CARE_PROVIDER_SITE_OTHER): Payer: BC Managed Care – PPO | Admitting: Gynecology

## 2012-01-04 ENCOUNTER — Encounter: Payer: Self-pay | Admitting: Gynecology

## 2012-01-04 VITALS — BP 122/72

## 2012-01-04 DIAGNOSIS — N760 Acute vaginitis: Secondary | ICD-10-CM

## 2012-01-04 DIAGNOSIS — Z113 Encounter for screening for infections with a predominantly sexual mode of transmission: Secondary | ICD-10-CM

## 2012-01-04 DIAGNOSIS — A499 Bacterial infection, unspecified: Secondary | ICD-10-CM

## 2012-01-04 DIAGNOSIS — B9689 Other specified bacterial agents as the cause of diseases classified elsewhere: Secondary | ICD-10-CM

## 2012-01-04 LAB — WET PREP FOR TRICH, YEAST, CLUE
Clue Cells Wet Prep HPF POC: NONE SEEN
Yeast Wet Prep HPF POC: NONE SEEN

## 2012-01-04 MED ORDER — CLINDAMYCIN PHOSPHATE 2 % VA CREA: 1.0000 | TOPICAL_CREAM | Freq: Every day | VAGINAL | Status: DC

## 2012-01-04 NOTE — Progress Notes (Signed)
Patient is a 41 year old who presented to the office today for test of cure. At time of her annual exam in December 3 her vaginal discharge demonstrated she had trichomoniasis and was treated with Flagyl 500 mg twice a day for 5 days. Her GC and Chlamydia culture were negative as was her HIV, RPR, hepatitis B and C. She is otherwise asymptomatic. She is no longer in relationship with the individual who gave her the trichomoniasis. But states that he did get treated.  Patient with history vitamin D deficiency currently on 50,000 units q. weekly for 12 weeks. She will return back after the 12 weeks for vitamin D level. After her vitamin D level returned back to normal she'll begin taking vitamin D 3 cholecalciferol 2000 units daily thereafter.  Pelvic exam: Bartholin urethra Skene glands: Within normal limits Vagina: No lesions or discharge Cervix: IUD string not seen Bimanual exam: Not done Rectal exam: Not done  GC and Chlamydia culture obtained results pending at time of this dictation Wet prep: 2 numerous to count white blood cells many bacteria  Assessment/plan: For apparent bacterial vaginosis she will be given a prescription for Cleocin vaginal cream to apply each bedtime for 5 nights. We will await the results of the GC and Chlamydia culture. Patient will return back next month for an ultrasound to confirm adequate position of her IUD since the string was not seen during exam today.

## 2012-01-04 NOTE — Patient Instructions (Signed)
Will do an ultrasound in January to confirm that IUD has not moved out of place. It is not unusual not to see the string during pelvic exam but just to make sure.  Transvaginal Ultrasound Transvaginal ultrasound is a pelvic ultrasound, using a metal probe that is placed in the vagina, to look at a women's female organs. Transvaginal ultrasound is a method of seeing inside the pelvis of a woman. The ultrasound machine sends out sound waves from the transducer (probe). These sound waves bounce off body structures (like an echo) to create a picture. The picture shows up on a monitor. It is called transvaginal because the probe is inserted into the vagina. There should be very little discomfort from the vaginal probe. This test can also be used during pregnancy. Endovaginal ultrasound is another name for a transvaginal ultrasound. In a transabdominal ultrasound, the probe is placed on the outside of the belly. This method gives pictures that are lower quality than pictures from the transvaginal technique. Transvaginal ultrasound is used to look for problems of the female genital tract. Some such problems include:  Infertility problems.  Congenital (birth defect) malformations of the uterus and ovaries.  Tumors in the uterus.  Abnormal bleeding.  Ovarian tumors and cysts.  Abscess (inflamed tissue around pus) in the pelvis.  Unexplained abdominal or pelvic pain.  Pelvic infection. DURING PREGNANCY, TRANSVAGINAL ULTRASOUND MAY BE USED TO LOOK AT:  Normal pregnancy.  Ectopic pregnancy (pregnancy outside the uterus).  Fetal heartbeat.  Abnormalities in the pelvis, that are not seen well with transabdominal ultrasound.  Suspected twins or multiples.  Impending miscarriage.  Problems with the cervix (incompetent cervix, not able to stay closed and hold the baby).  When doing an amniocentesis (removing fluid from the pregnancy sac, for testing).  Looking for abnormalities of the  baby.  Checking the growth, development, and age of the fetus.  Measuring the amount of fluid in the amniotic sac.  When doing an external version of the baby (moving baby into correct position).  Evaluating the baby for problems in high risk pregnancies (biophysical profile).  Suspected fetal demise (death). Sometimes a special ultrasound method called Saline Infusion Sonography (SIS) is used for a more accurate look at the uterus. Sterile saline (salt water) is injected into the uterus of non-pregnant patients to see the inside of the uterus better. SIS is not used on pregnant women. The vaginal probe can also assist in obtaining biopsies of abnormal areas, in draining fluid from cysts on the ovary, and in finding IUDs (intrauterine device, birth control) that cannot be located. PREPARATION FOR TEST A transvaginal ultrasound is done with the bladder empty. The transabdominal ultrasound is done with your bladder full. You may be asked to drink several glasses of water before that exam. Sometimes, a transabdominal ultrasound is done just after a transvaginal ultrasound, to look at organs in your abdomen. PROCEDURE  You will lie down on a table, with your knees bent and your feet in foot holders. The probe is covered with a condom. A sterile lubricant is put into the vagina and on the probe. The lubricant helps transmit the sound waves and avoid irritating the vagina. Your caregiver will move the probe inside the vaginal cavity to scan the pelvic structures. A normal test will show a normal pelvis and normal contents. An abnormal test will show abnormalities of the pelvis, placenta, or baby. ABNORMAL RESULTS MAY BE DUE TO:  Growths or tumors in the:  Uterus.  Ovaries.  Vagina.  Other pelvic structures.  Non-cancerous growths of the uterus and ovaries.  Twisting of the ovary, cutting off blood supply to the ovary (ovarian torsion).  Areas of infection, including:  Pelvic inflammatory  disease.  Abscess in the pelvis.  Locating an IUD. PROBLEMS FOUND IN PREGNANT WOMEN MAY INCLUDE:  Ectopic pregnancy (pregnancy outside the uterus).  Multiple pregnancies.  Early dilation (opening) of the cervix. This may indicate an incompetent cervix and early delivery.  Impending miscarriage.  Fetal death.  Problems with the placenta, including:  Placenta has grown over the opening of the womb (placenta previa).  Placenta has separated early in the womb (placental abruption).  Placenta grows into the muscle of the uterus (placenta accreta).  Tumors of pregnancy, including gestational trophoblastic disease. This is an abnormal pregnancy, with no fetus. The uterus is filled with many grape-like cysts that could sometimes be cancerous.  Incorrect position of the fetus (breech, vertex).  Intrauterine fetal growth retardation (IUGR) (poor growth in the womb).  Fetal abnormalities or infection. RISKS AND COMPLICATIONS There are no known risks to the ultrasound procedure. There is no X-ray used when doing an ultrasound. Document Released: 12/17/2003 Document Revised: 03/29/2011 Document Reviewed: 12/04/2008 [facility] Patient Information 2013 [place], [place].  Bacterial Vaginosis Bacterial vaginosis (BV) is a vaginal infection where the normal balance of bacteria in the vagina is disrupted. The normal balance is then replaced by an overgrowth of certain bacteria. There are several different kinds of bacteria that can cause BV. BV is the most common vaginal infection in women of childbearing age. CAUSES   The cause of BV is not fully understood. BV develops when there is an increase or imbalance of harmful bacteria.  Some activities or behaviors can upset the normal balance of bacteria in the vagina and put women at increased risk including:  Having a new sex partner or multiple sex partners.  Douching.  Using an intrauterine device (IUD) for contraception.  It is not clear  what role sexual activity plays in the development of BV. However, women that have never had sexual intercourse are rarely infected with BV. Women do not get BV from toilet seats, bedding, swimming pools or from touching objects around them.  SYMPTOMS   Grey vaginal discharge.  A fish-like odor with discharge, especially after sexual intercourse.  Itching or burning of the vagina and vulva.  Burning or pain with urination.  Some women have no signs or symptoms at all. DIAGNOSIS  Your caregiver must examine the vagina for signs of BV. Your caregiver will perform lab tests and look at the sample of vaginal fluid through a microscope. They will look for bacteria and abnormal cells (clue cells), a pH test higher than 4.5, and a positive amine test all associated with BV.  RISKS AND COMPLICATIONS   Pelvic inflammatory disease (PID).  Infections following gynecology surgery.  Developing HIV.  Developing herpes virus. TREATMENT  Sometimes BV will clear up without treatment. However, all women with symptoms of BV should be treated to avoid complications, especially if gynecology surgery is planned. Female partners generally do not need to be treated. However, BV may spread between female sex partners so treatment is helpful in preventing a recurrence of BV.   BV may be treated with antibiotics. The antibiotics come in either pill or vaginal cream forms. Either can be used with nonpregnant or pregnant women, but the recommended dosages differ. These antibiotics are not harmful to the baby.  BV can recur after treatment. If  this happens, a second round of antibiotics will often be prescribed.  Treatment is important for pregnant women. If not treated, BV can cause a premature delivery, especially for a pregnant woman who had a premature birth in the past. All pregnant women who have symptoms of BV should be checked and treated.  For chronic reoccurrence of BV, treatment with a type of prescribed  gel vaginally twice a week is helpful. HOME CARE INSTRUCTIONS   Finish all medication as directed by your caregiver.  Do not have sex until treatment is completed.  Tell your sexual partner that you have a vaginal infection. They should see their caregiver and be treated if they have problems, such as a mild rash or itching.  Practice safe sex. Use condoms. Only have 1 sex partner. PREVENTION  Basic prevention steps can help reduce the risk of upsetting the natural balance of bacteria in the vagina and developing BV:  Do not have sexual intercourse (be abstinent).  Do not douche.  Use all of the medicine prescribed for treatment of BV, even if the signs and symptoms go away.  Tell your sex partner if you have BV. That way, they can be treated, if needed, to prevent reoccurrence. SEEK MEDICAL CARE IF:   Your symptoms are not improving after 3 days of treatment.  You have increased discharge, pain, or fever. MAKE SURE YOU:   Understand these instructions.  Will watch your condition.  Will get help right away if you are not doing well or get worse. FOR MORE INFORMATION  Division of STD Prevention (DSTDP), Centers for Disease Control and Prevention: [employer] American [employer] Health Association (ASHA): www.ashastd.org  Document Released: 01/04/2005 Document Revised: 03/29/2011 Document Reviewed: 06/27/2008 [facility] Patient Information 2013 [place], [place].

## 2012-01-24 ENCOUNTER — Ambulatory Visit (INDEPENDENT_AMBULATORY_CARE_PROVIDER_SITE_OTHER): Payer: BC Managed Care – PPO

## 2012-01-24 ENCOUNTER — Ambulatory Visit (INDEPENDENT_AMBULATORY_CARE_PROVIDER_SITE_OTHER): Payer: BC Managed Care – PPO | Admitting: Gynecology

## 2012-01-24 DIAGNOSIS — D259 Leiomyoma of uterus, unspecified: Secondary | ICD-10-CM

## 2012-01-24 DIAGNOSIS — D251 Intramural leiomyoma of uterus: Secondary | ICD-10-CM

## 2012-01-24 DIAGNOSIS — N83202 Unspecified ovarian cyst, left side: Secondary | ICD-10-CM

## 2012-01-24 DIAGNOSIS — N8300 Follicular cyst of ovary, unspecified side: Secondary | ICD-10-CM

## 2012-01-24 DIAGNOSIS — N83209 Unspecified ovarian cyst, unspecified side: Secondary | ICD-10-CM

## 2012-01-24 DIAGNOSIS — T8332XA Displacement of intrauterine contraceptive device, initial encounter: Secondary | ICD-10-CM

## 2012-01-24 NOTE — Progress Notes (Signed)
Patient is a 42 year old who was seen in the office on December 17 for test of cure.At time of her annual exam in December 3 her vaginal discharge demonstrated she had trichomoniasis and was treated with Flagyl 500 mg twice a day for 5 days. Her GC and Chlamydia culture were negative as was her HIV, RPR, hepatitis B and C. She is otherwise asymptomatic. She is no longer in relationship with the individual who gave her the trichomoniasis. But states that he did get treated. She was treated at the last office visit for bacterial vaginosis and was asked to return to the office today for an ultrasound since the IUD string was not seen. Patient has a Mirena IUD that was placed in 2013.  Ultrasound: Uterus measured 9.7 x 6.3 x 4.9 cm with endometrial stripe 4.2 mm. A small intramural fibroid measuring 9 x 7 mm, 11 x 10 mm was noted. Right ovary was normal. Left ovary thinwall echo-free follicle cyst measuring 29 x 19 x 20 mm negative fluid in the cul-de-sac. IUD was seen in normal position.  The patient left the office before we were able to discuss the ultrasound with her. She will be contacted and an informed of the findings. I have recommended that she return back to the office in 4 months for followup ultrasound.

## 2012-01-25 ENCOUNTER — Telehealth: Payer: Self-pay | Admitting: *Deleted

## 2012-01-25 DIAGNOSIS — N83202 Unspecified ovarian cyst, left side: Secondary | ICD-10-CM

## 2012-01-25 NOTE — Telephone Encounter (Signed)
Message copied by Aura Camps on Tue Jan 25, 2012 10:11 AM ------      Message from: Ok Edwards      Created: Mon Jan 24, 2012 11:05 AM       Victorino Dike, please inform patient that she had left the office before I had a chance to discuss with her the results of the ultrasound. Please let her know that the ultrasound demonstrated that the IUD was in the right position. She has a very small left ovarian cyst and I would like to followup with an ultrasound in 4 months. Please schedule that along with an appointment to see me afterwards.

## 2012-01-25 NOTE — Telephone Encounter (Signed)
Pt informed with the below note, order placed for ultrasound.

## 2012-02-17 ENCOUNTER — Telehealth: Payer: Self-pay | Admitting: *Deleted

## 2012-02-17 MED ORDER — CLINDAMYCIN PHOSPHATE 2 % VA CREA: 1.0000 | TOPICAL_CREAM | Freq: Every day | VAGINAL | Status: DC

## 2012-02-17 NOTE — Telephone Encounter (Signed)
Pt never picked Rx for Cleocin vagina cream 2% giving on 01/04/12. Pt said that she had a lot of stress with family at the time and didn't try to pick Rx until recently. Rx sent to pharmacy.

## 2012-03-17 ENCOUNTER — Encounter: Payer: Self-pay | Admitting: Gynecology

## 2012-03-17 ENCOUNTER — Ambulatory Visit (INDEPENDENT_AMBULATORY_CARE_PROVIDER_SITE_OTHER): Payer: BC Managed Care – PPO | Admitting: Gynecology

## 2012-03-17 VITALS — BP 132/78

## 2012-03-17 DIAGNOSIS — A499 Bacterial infection, unspecified: Secondary | ICD-10-CM

## 2012-03-17 DIAGNOSIS — Z113 Encounter for screening for infections with a predominantly sexual mode of transmission: Secondary | ICD-10-CM

## 2012-03-17 DIAGNOSIS — N760 Acute vaginitis: Secondary | ICD-10-CM

## 2012-03-17 LAB — WET PREP FOR TRICH, YEAST, CLUE
Trich, Wet Prep: NONE SEEN
Yeast Wet Prep HPF POC: NONE SEEN

## 2012-03-17 MED ORDER — METRONIDAZOLE 500 MG PO TABS: 500.0000 mg | ORAL_TABLET | Freq: Two times a day (BID) | ORAL | Status: DC

## 2012-03-17 NOTE — Patient Instructions (Signed)
Bacterial Vaginosis Bacterial vaginosis (BV) is a vaginal infection where the normal balance of bacteria in the vagina is disrupted. The normal balance is then replaced by an overgrowth of certain bacteria. There are several different kinds of bacteria that can cause BV. BV is the most common vaginal infection in women of childbearing age. CAUSES   The cause of BV is not fully understood. BV develops when there is an increase or imbalance of harmful bacteria.  Some activities or behaviors can upset the normal balance of bacteria in the vagina and put women at increased risk including:  Having a new sex partner or multiple sex partners.  Douching.  Using an intrauterine device (IUD) for contraception.  It is not clear what role sexual activity plays in the development of BV. However, women that have never had sexual intercourse are rarely infected with BV. Women do not get BV from toilet seats, bedding, swimming pools or from touching objects around them.  SYMPTOMS   Grey vaginal discharge.  A fish-like odor with discharge, especially after sexual intercourse.  Itching or burning of the vagina and vulva.  Burning or pain with urination.  Some women have no signs or symptoms at all. DIAGNOSIS  Your caregiver must examine the vagina for signs of BV. Your caregiver will perform lab tests and look at the sample of vaginal fluid through a microscope. They will look for bacteria and abnormal cells (clue cells), a pH test higher than 4.5, and a positive amine test all associated with BV.  RISKS AND COMPLICATIONS   Pelvic inflammatory disease (PID).  Infections following gynecology surgery.  Developing HIV.  Developing herpes virus. TREATMENT  Sometimes BV will clear up without treatment. However, all women with symptoms of BV should be treated to avoid complications, especially if gynecology surgery is planned. Female partners generally do not need to be treated. However, BV may spread  between female sex partners so treatment is helpful in preventing a recurrence of BV.   BV may be treated with antibiotics. The antibiotics come in either pill or vaginal cream forms. Either can be used with nonpregnant or pregnant women, but the recommended dosages differ. These antibiotics are not harmful to the baby.  BV can recur after treatment. If this happens, a second round of antibiotics will often be prescribed.  Treatment is important for pregnant women. If not treated, BV can cause a premature delivery, especially for a pregnant woman who had a premature birth in the past. All pregnant women who have symptoms of BV should be checked and treated.  For chronic reoccurrence of BV, treatment with a type of prescribed gel vaginally twice a week is helpful. HOME CARE INSTRUCTIONS   Finish all medication as directed by your caregiver.  Do not have sex until treatment is completed.  Tell your sexual partner that you have a vaginal infection. They should see their caregiver and be treated if they have problems, such as a mild rash or itching.  Practice safe sex. Use condoms. Only have 1 sex partner. PREVENTION  Basic prevention steps can help reduce the risk of upsetting the natural balance of bacteria in the vagina and developing BV:  Do not have sexual intercourse (be abstinent).  Do not douche.  Use all of the medicine prescribed for treatment of BV, even if the signs and symptoms go away.  Tell your sex partner if you have BV. That way, they can be treated, if needed, to prevent reoccurrence. SEEK MEDICAL CARE IF:     Your symptoms are not improving after 3 days of treatment.  You have increased discharge, pain, or fever. MAKE SURE YOU:   Understand these instructions.  Will watch your condition.  Will get help right away if you are not doing well or get worse. FOR MORE INFORMATION  Division of STD Prevention (DSTDP), Centers for Disease Control and Prevention:  www.cdc.gov/std American Social Health Association (ASHA): www.ashastd.org  Document Released: 01/04/2005 Document Revised: 03/29/2011 Document Reviewed: 06/27/2008 [employer] Patient Information 2013 [employer].  

## 2012-03-17 NOTE — Progress Notes (Signed)
Patient presented to the office today complaining of vaginal irritation and pruritus. She has an IUD for contraception and was using condoms.  Exam: Bartholin urethra Skene was within normal limits Vagina: Clear fascia odor discharge Cervix: IUD string seen Bimanual exam not done Rectal exam: Not done  Wet prep: Positive Amine, many clue cells, few white blood cell, too numerous to count bacteria  GC and Chlamydia culture obtained results pending at time of this dictation  Assessment/plan: Clinical evidence of bacterial vaginosis. Patient will be treated with Flagyl 500 mg one by mouth twice a day for 5 days. We will notify the patient if there is any abnormality on the [facility] and Chlamydia culture.

## 2012-03-18 LAB — GC/CHLAMYDIA PROBE AMP
CT Probe RNA: NEGATIVE
GC Probe RNA: NEGATIVE

## 2012-05-02 ENCOUNTER — Ambulatory Visit (INDEPENDENT_AMBULATORY_CARE_PROVIDER_SITE_OTHER): Payer: BC Managed Care – PPO | Admitting: Gynecology

## 2012-05-02 ENCOUNTER — Encounter: Payer: Self-pay | Admitting: Gynecology

## 2012-05-02 VITALS — BP 134/82

## 2012-05-02 DIAGNOSIS — N760 Acute vaginitis: Secondary | ICD-10-CM

## 2012-05-02 DIAGNOSIS — Z113 Encounter for screening for infections with a predominantly sexual mode of transmission: Secondary | ICD-10-CM

## 2012-05-02 DIAGNOSIS — A499 Bacterial infection, unspecified: Secondary | ICD-10-CM

## 2012-05-02 DIAGNOSIS — N899 Noninflammatory disorder of vagina, unspecified: Secondary | ICD-10-CM

## 2012-05-02 DIAGNOSIS — B373 Candidiasis of vulva and vagina: Secondary | ICD-10-CM

## 2012-05-02 DIAGNOSIS — N898 Other specified noninflammatory disorders of vagina: Secondary | ICD-10-CM

## 2012-05-02 LAB — WET PREP FOR TRICH, YEAST, CLUE: Trich, Wet Prep: NONE SEEN

## 2012-05-02 MED ORDER — TINIDAZOLE 500 MG PO TABS: ORAL_TABLET | ORAL | Status: DC

## 2012-05-02 MED ORDER — FLUCONAZOLE 100 MG PO TABS: ORAL_TABLET | ORAL | Status: DC

## 2012-05-02 NOTE — Patient Instructions (Signed)
Bacterial Vaginosis Bacterial vaginosis (BV) is a vaginal infection where the normal balance of bacteria in the vagina is disrupted. The normal balance is then replaced by an overgrowth of certain bacteria. There are several different kinds of bacteria that can cause BV. BV is the most common vaginal infection in women of childbearing age. CAUSES   The cause of BV is not fully understood. BV develops when there is an increase or imbalance of harmful bacteria.  Some activities or behaviors can upset the normal balance of bacteria in the vagina and put women at increased risk including:  Having a new sex partner or multiple sex partners.  Douching.  Using an intrauterine device (IUD) for contraception.  It is not clear what role sexual activity plays in the development of BV. However, women that have never had sexual intercourse are rarely infected with BV. Women do not get BV from toilet seats, bedding, swimming pools or from touching objects around them.  SYMPTOMS   Grey vaginal discharge.  A fish-like odor with discharge, especially after sexual intercourse.  Itching or burning of the vagina and vulva.  Burning or pain with urination.  Some women have no signs or symptoms at all. DIAGNOSIS  Your caregiver must examine the vagina for signs of BV. Your caregiver will perform lab tests and look at the sample of vaginal fluid through a microscope. They will look for bacteria and abnormal cells (clue cells), a pH test higher than 4.5, and a positive amine test all associated with BV.  RISKS AND COMPLICATIONS   Pelvic inflammatory disease (PID).  Infections following gynecology surgery.  Developing HIV.  Developing herpes virus. TREATMENT  Sometimes BV will clear up without treatment. However, all women with symptoms of BV should be treated to avoid complications, especially if gynecology surgery is planned. Female partners generally do not need to be treated. However, BV may spread  between female sex partners so treatment is helpful in preventing a recurrence of BV.   BV may be treated with antibiotics. The antibiotics come in either pill or vaginal cream forms. Either can be used with nonpregnant or pregnant women, but the recommended dosages differ. These antibiotics are not harmful to the baby.  BV can recur after treatment. If this happens, a second round of antibiotics will often be prescribed.  Treatment is important for pregnant women. If not treated, BV can cause a premature delivery, especially for a pregnant woman who had a premature birth in the past. All pregnant women who have symptoms of BV should be checked and treated.  For chronic reoccurrence of BV, treatment with a type of prescribed gel vaginally twice a week is helpful. HOME CARE INSTRUCTIONS   Finish all medication as directed by your caregiver.  Do not have sex until treatment is completed.  Tell your sexual partner that you have a vaginal infection. They should see their caregiver and be treated if they have problems, such as a mild rash or itching.  Practice safe sex. Use condoms. Only have 1 sex partner. PREVENTION  Basic prevention steps can help reduce the risk of upsetting the natural balance of bacteria in the vagina and developing BV:  Do not have sexual intercourse (be abstinent).  Do not douche.  Use all of the medicine prescribed for treatment of BV, even if the signs and symptoms go away.  Tell your sex partner if you have BV. That way, they can be treated, if needed, to prevent reoccurrence. SEEK MEDICAL CARE IF:     Your symptoms are not improving after 3 days of treatment.  You have increased discharge, pain, or fever. MAKE SURE YOU:   Understand these instructions.  Will watch your condition.  Will get help right away if you are not doing well or get worse. FOR MORE INFORMATION  Division of STD Prevention (DSTDP), Centers for Disease Control and Prevention:  [employer]Apps.[employer].za American Social Health Association (ASHA): www.ashastd.org  Document Released: 01/04/2005 Document Revised: 03/29/2011 Document Reviewed: 06/27/2008 [facility] Patient Information 2013 [place], [place].  Candidal Vulvovaginitis Candidal vulvovaginitis is an infection of the vagina and vulva. The vulva is the skin around the opening of the vagina. This may cause itching and discomfort in and around the vagina.  HOME CARE  Only take medicine as told by your doctor.  Do not have sex (intercourse) until the infection is healed or as told by your doctor.  Practice safe sex.  Tell your sex partner about your infection.  Do not douche or use tampons.  Wear cotton underwear. Do not wear tight pants or panty hose.  Eat yogurt. This may help treat and prevent yeast infections. GET HELP RIGHT AWAY IF:   You have a fever.  Your problems get worse during treatment or do not get better in 3 days.  You have discomfort, irritation, or itching in your vagina or vulva area.  You have pain after sex.  You start to get belly (abdominal) pain. MAKE SURE YOU:  Understand these instructions.  Will watch your condition.  Will get help right away if you are not doing well or get worse. Document Released: 04/02/2008 Document Revised: 03/29/2011 Document Reviewed: 04/02/2008 [facility] Patient Information 2013 [place], [place].

## 2012-05-02 NOTE — Progress Notes (Signed)
42 year old who presented to the office today stating that when she was having intercourse 2 days ago the condom broke although she has a Mirena IUD for contraception. She became irritated as a result of the latex material from the condom. She denies any discharge. Patient wanted an STD screen.  External genitalia, Bartholin urethra Skene was within normal limits Vagina: White discharge was noted erythematous vaginal mucosa. Cervix: No gross lesions on inspection GC and Chlamydia culture obtained along with wet prep Bimanual exam not done Adnexa: No palpable mass or tenderness Rectal exam not done  Wet prep: Positive Amine, moderate clue cell, tumors to count bacteria.few yeast were noted GC Chlamydia culture pending  Assessment/plan: Patient with bacterial vaginosis and vulvovaginitis attributed to monilia. Patient will be placed on Tindamax 500 mg 4 tablets today to repeat in 24 hours. She was prescribe Diflucan 100 mg to take 1 by mouth today. GC and Chlamydia culture pending at time of this dictation. Of note in February this year patient had a negative GC and Chlamydia culture.

## 2012-05-03 LAB — GC/CHLAMYDIA PROBE AMP: CT Probe RNA: NEGATIVE

## 2012-05-17 ENCOUNTER — Ambulatory Visit (INDEPENDENT_AMBULATORY_CARE_PROVIDER_SITE_OTHER): Payer: BC Managed Care – PPO | Admitting: Gynecology

## 2012-05-17 ENCOUNTER — Ambulatory Visit (INDEPENDENT_AMBULATORY_CARE_PROVIDER_SITE_OTHER): Payer: BC Managed Care – PPO

## 2012-05-17 ENCOUNTER — Encounter: Payer: Self-pay | Admitting: Gynecology

## 2012-05-17 VITALS — BP 128/84

## 2012-05-17 DIAGNOSIS — N83201 Unspecified ovarian cyst, right side: Secondary | ICD-10-CM

## 2012-05-17 DIAGNOSIS — N83202 Unspecified ovarian cyst, left side: Secondary | ICD-10-CM

## 2012-05-17 DIAGNOSIS — N83209 Unspecified ovarian cyst, unspecified side: Secondary | ICD-10-CM

## 2012-05-17 MED ORDER — MEDROXYPROGESTERONE ACETATE 150 MG/ML IM SUSP
150.0000 mg | Freq: Once | INTRAMUSCULAR | Status: AC
  Administered 2012-05-17: 150 mg via INTRAMUSCULAR

## 2012-05-17 NOTE — Patient Instructions (Addendum)
Ovarian Cyst The ovaries are small organs that are on each side of the uterus. The ovaries are the organs that produce the female hormones, estrogen and progesterone. An ovarian cyst is a sac filled with fluid that can vary in its size. It is normal for a small cyst to form in women who are in the childbearing age and who have menstrual periods. This type of cyst is called a follicle cyst that becomes an ovulation cyst (corpus luteum cyst) after it produces the women's egg. It later goes away on its own if the woman does not become pregnant. There are other kinds of ovarian cysts that may cause problems and may need to be treated. The most serious problem is a cyst with cancer. It should be noted that menopausal women who have an ovarian cyst are at a higher risk of it being a cancer cyst. They should be evaluated very quickly, thoroughly and followed closely. This is especially true in menopausal women because of the high rate of ovarian cancer in women in menopause. CAUSES AND TYPES OF OVARIAN CYSTS:  FUNCTIONAL CYST: The follicle/corpus luteum cyst is a functional cyst that occurs every month during ovulation with the menstrual cycle. They go away with the next menstrual cycle if the woman does not get pregnant. Usually, there are no symptoms with a functional cyst.  ENDOMETRIOMA CYST: This cyst develops from the lining of the uterus tissue. This cyst gets in or on the ovary. It grows every month from the bleeding during the menstrual period. It is also called a "chocolate cyst" because it becomes filled with blood that turns brown. This cyst can cause pain in the lower abdomen during intercourse and with your menstrual period.  CYSTADENOMA CYST: This cyst develops from the cells on the outside of the ovary. They usually are not cancerous. They can get very big and cause lower abdomen pain and pain with intercourse. This type of cyst can twist on itself, cut off its blood supply and cause severe pain. It  also can easily rupture and cause a lot of pain.  DERMOID CYST: This type of cyst is sometimes found in both ovaries. They are found to have different kinds of body tissue in the cyst. The tissue includes skin, teeth, hair, and/or cartilage. They usually do not have symptoms unless they get very big. Dermoid cysts are rarely cancerous.  POLYCYSTIC OVARY: This is a rare condition with hormone problems that produces many small cysts on both ovaries. The cysts are follicle-like cysts that never produce an egg and become a corpus luteum. It can cause an increase in body weight, infertility, acne, increase in body and facial hair and lack of menstrual periods or rare menstrual periods. Many women with this problem develop type 2 diabetes. The exact cause of this problem is unknown. A polycystic ovary is rarely cancerous.  THECA LUTEIN CYST: Occurs when too much hormone (human chorionic gonadotropin) is produced and over-stimulates the ovaries to produce an egg. They are frequently seen when doctors stimulate the ovaries for invitro-fertilization (test tube babies).  LUTEOMA CYST: This cyst is seen during pregnancy. Rarely it can cause an obstruction to the birth canal during labor and delivery. They usually go away after delivery. SYMPTOMS   Pelvic pain or pressure.  Pain during sexual intercourse.  Increasing girth (swelling) of the abdomen.  Abnormal menstrual periods.  Increasing pain with menstrual periods.  You stop having menstrual periods and you are not pregnant. DIAGNOSIS  The diagnosis can   be made during:  Routine or annual pelvic examination (common).  Ultrasound.  X-ray of the pelvis.  CT Scan.  MRI.  Blood tests. TREATMENT   Treatment may only be to follow the cyst monthly for 2 to 3 months with your caregiver. Many go away on their own, especially functional cysts.  May be aspirated (drained) with a long needle with ultrasound, or by laparoscopy (inserting a tube into  the pelvis through a small incision).  The whole cyst can be removed by laparoscopy.  Sometimes the cyst may need to be removed through an incision in the lower abdomen.  Hormone treatment is sometimes used to help dissolve certain cysts.  Birth control pills are sometimes used to help dissolve certain cysts. HOME CARE INSTRUCTIONS  Follow your caregiver's advice regarding:  Medicine.  Follow up visits to evaluate and treat the cyst.  You may need to come back or make an appointment with another caregiver, to find the exact cause of your cyst, if your caregiver is not a gynecologist.  Get your yearly and recommended pelvic examinations and Pap tests.  Let your caregiver know if you have had an ovarian cyst in the past. SEEK MEDICAL CARE IF:   Your periods are late, irregular, they stop, or are painful.  Your stomach (abdomen) or pelvic pain does not go away.  Your stomach becomes larger or swollen.  You have pressure on your bladder or trouble emptying your bladder completely.  You have painful sexual intercourse.  You have feelings of fullness, pressure, or discomfort in your stomach.  You lose weight for no apparent reason.  You feel generally ill.  You become constipated.  You lose your appetite.  You develop acne.  You have an increase in body and facial hair.  You are gaining weight, without changing your exercise and eating habits.  You think you are pregnant. SEEK IMMEDIATE MEDICAL CARE IF:   You have increasing abdominal pain.  You feel sick to your stomach (nausea) and/or vomit.  You develop a fever that comes on suddenly.  You develop abdominal pain during a bowel movement.  Your menstrual periods become heavier than usual. Document Released: 01/04/2005 Document Revised: 03/29/2011 Document Reviewed: 11/07/2008 [employer] Patient Information 2013 [employer].  

## 2012-05-17 NOTE — Progress Notes (Signed)
Patient presented to the office today for followup ultrasound as a result at time of exam on January 2014 the IUD string had not been seen. The ultrasound from that day demonstrated the following:  Uterus measured 9.7 x 6.3 x 4.9 cm with endometrial stripe 4.2 mm. A small intramural fibroid measuring 9 x 7 mm, 11 x 10 mm was noted. Right ovary was normal. Left ovary thinwall echo-free follicle cyst measuring 29 x 19 x 20 mm negative fluid in the cul-de-sac. IUD was seen in normal position.   She was instructed to return back to the office for followup ultrasound which she did today. Today's ultrasound demonstrated the following : Uterus measured 10 x 5 x 4.0 cm with endometrial stripe of 3.2 mm. IUD was seen in normal position. Left ovary only a small follicle was noted measuring 22 x 20 mm. Her right ovarian thinwall echo-free avascular cyst measuring 5.3 x 4.4 x 2.9 cm was noted. Previous cyst has not seen. There was no fluid in the cul-de-sac.  Assessment/plan: Patient followup ultrasound today demonstrates complete resolution of previous seen left ovarian cyst. Now with a larger right ovarian cyst. Patient will be given Depo-Provera 150 mg IM and have her to see the cyst will collapse completely. She will return back to the office in 3 months for followup ultrasound. Of note patient is otherwise asymptomatic.

## 2012-08-23 ENCOUNTER — Ambulatory Visit: Payer: BC Managed Care – PPO | Admitting: Gynecology

## 2012-08-23 ENCOUNTER — Other Ambulatory Visit: Payer: BC Managed Care – PPO

## 2013-01-15 ENCOUNTER — Encounter: Payer: Self-pay | Admitting: Women's Health

## 2013-01-15 ENCOUNTER — Ambulatory Visit (INDEPENDENT_AMBULATORY_CARE_PROVIDER_SITE_OTHER): Payer: Self-pay | Admitting: Women's Health

## 2013-01-15 DIAGNOSIS — N898 Other specified noninflammatory disorders of vagina: Secondary | ICD-10-CM

## 2013-01-15 DIAGNOSIS — N899 Noninflammatory disorder of vagina, unspecified: Secondary | ICD-10-CM

## 2013-01-15 LAB — WET PREP FOR TRICH, YEAST, CLUE
Clue Cells Wet Prep HPF POC: NONE SEEN
Trich, Wet Prep: NONE SEEN

## 2013-01-15 MED ORDER — METRONIDAZOLE 500 MG PO TABS: 500.0000 mg | ORAL_TABLET | Freq: Two times a day (BID) | ORAL | Status: DC

## 2013-01-15 NOTE — Progress Notes (Signed)
Patient ID: Gabriella Lutz, female   DOB: [birth year], 42 y.o.   MRN: [id number] Presents with complaint of vaginal irritation and with scant discharge. Denies odor, vaginal itching. Yeast over-the-counter Monistat with no relief. Light cycle every one to 2 months/Mirena IUD placed 10/2011. Same partner.  Exam: Appears well. External genitalia slight erythema at introitus, speculum exam scant white discharge with no visible erythema,no noted odor. Wet prep negative. Bimanual no CMT or adnexal fullness or tenderness. IUD strings visible.  Vaginal irritation  Plan: Reviewed normality of exam and wet prep. Will wear loose clothing, instructed to call if continued symptoms.

## 2013-11-19 ENCOUNTER — Encounter: Payer: Self-pay | Admitting: Women's Health

## 2015-12-25 ENCOUNTER — Encounter: Payer: Self-pay | Admitting: Women's Health

## 2015-12-25 ENCOUNTER — Ambulatory Visit (INDEPENDENT_AMBULATORY_CARE_PROVIDER_SITE_OTHER): Payer: 59 | Admitting: Women's Health

## 2015-12-25 VITALS — BP 130/82 | Ht 67.0 in | Wt 268.0 lb

## 2015-12-25 DIAGNOSIS — B9689 Other specified bacterial agents as the cause of diseases classified elsewhere: Secondary | ICD-10-CM | POA: Diagnosis not present

## 2015-12-25 DIAGNOSIS — N760 Acute vaginitis: Secondary | ICD-10-CM

## 2015-12-25 DIAGNOSIS — Z01419 Encounter for gynecological examination (general) (routine) without abnormal findings: Secondary | ICD-10-CM

## 2015-12-25 DIAGNOSIS — N898 Other specified noninflammatory disorders of vagina: Secondary | ICD-10-CM | POA: Diagnosis not present

## 2015-12-25 DIAGNOSIS — Z1329 Encounter for screening for other suspected endocrine disorder: Secondary | ICD-10-CM | POA: Diagnosis not present

## 2015-12-25 DIAGNOSIS — Z13220 Encounter for screening for lipoid disorders: Secondary | ICD-10-CM

## 2015-12-25 MED ORDER — METRONIDAZOLE 500 MG PO TABS: 500.0000 mg | ORAL_TABLET | Freq: Two times a day (BID) | ORAL | 0 refills | Status: DC

## 2015-12-25 NOTE — Progress Notes (Signed)
Gabriella Lutz 09-Nov-1970 [id number]    History:    Presents for annual exam.  Rare spotting with Mirena IUD placed 10/2011. Negative STD screen with current partner at health Department. Normal Pap history overdue has not had a screening mammogram.  Past medical history, past surgical history, family history and social history were all reviewed and documented in the EPIC chart. Works in a lab. Daughters are 19 and 51 both doing well. History of GDM. Mother hypertension and arthritis, father diabetes and arthritis.  ROS:  A ROS was performed and pertinent positives and negatives are included.  Exam:  Vitals:   12/25/15 1610  BP: 130/82  Weight: 268 lb (121.6 kg)  Height: 5\' 7"  (1.702 m)   Body mass index is 41.97 kg/m.   General appearance:  Normal Thyroid:  Symmetrical, normal in size, without palpable masses or nodularity. Respiratory  Auscultation:  Clear without wheezing or rhonchi Cardiovascular  Auscultation:  Regular rate, without rubs, murmurs or gallops  Edema/varicosities:  Not grossly evident Abdominal  Soft,nontender, without masses, guarding or rebound.  Liver/spleen:  No organomegaly noted  Hernia:  None appreciated  Skin  Inspection:  Grossly normal   Breasts: Examined lying and sitting.     Right: Without masses, retractions, discharge or axillary adenopathy.     Left: Without masses, retractions, discharge or axillary adenopathy. Gentitourinary   Inguinal/mons:  Normal without inguinal adenopathy  External genitalia:  Normal  BUS/Urethra/Skene's glands:  Normal  Vagina:  Bloody discharge with odor positive for clues and TNTC bacteria  Cervix:  Normal IUD strings visible  Uterus:   normal in size, shape and contour.  Midline and mobile  Adnexa/parametria:     Rt: Without masses or tenderness.   Lt: Without masses or tenderness.  Anus and perineum: Normal  Digital rectal exam: Normal sphincter tone without palpated masses or  tenderness  Assessment/Plan:  45 y.o. SBF G2 P2 for annual exam.     10/2011 Mirena IUD-rare bleeding Bacteria vaginosis Morbid obesity  Plan: Flagyl 500 twice daily for 7 days #14, alcohol precautions reviewed. SBE's, instructed to get screening annual ultrasound, breast center information given. Exercise, calcium rich diet, vitamin D 1000 daily encouraged. Instructed to return to office in October 2018 for Dr.  to remove and replace Mirena IUD. Reviewed importance of increasing exercise and decreasing calories for weight loss. CBC, lipid panel, CMP, vitamin D, TSH, UA, Pap with HR HPV typing, new screening guidelines reviewed.    Huel Cote WHNP, 5:10 PM 12/25/2015

## 2015-12-25 NOTE — Patient Instructions (Addendum)
Breast center  [phone]  Bacterial Vaginosis Bacterial vaginosis is an infection of the vagina. It happens when too many germs (bacteria) grow in the vagina. This infection puts you at risk for infections from sex (STIs). Treating this infection can lower your risk for some STIs. You should also treat this if you are pregnant. It can cause your baby to be born early. Follow these instructions at home: Medicines  Take over-the-counter and prescription medicines only as told by your doctor.  Take or use your antibiotic medicine as told by your doctor. Do not stop taking or using it even if you start to feel better. General instructions  If you your sexual partner is a woman, tell her that you have this infection. She needs to get treatment if she has symptoms. If you have a female partner, he does not need to be treated.  During treatment:  Avoid sex.  Do not douche.  Avoid alcohol as told.  Avoid breastfeeding as told.  Drink enough fluid to keep your pee (urine) clear or pale yellow.  Keep your vagina and butt (rectum) clean.  Wash the area with warm water every day.  Wipe from front to back after you use the toilet.  Keep all follow-up visits as told by your doctor. This is important. Preventing this condition  Do not douche.  Use only warm water to wash around your vagina.  Use protection when you have sex. This includes:  Latex condoms.  Dental dams.  Limit how many people you have sex with. It is best to only have sex with the same person (be monogamous).  Get tested for STIs. Have your partner get tested.  Wear underwear that is cotton or lined with cotton.  Avoid tight pants and pantyhose. This is most important in summer.  Do not use any products that have nicotine or tobacco in them. These include cigarettes and e-cigarettes. If you need help quitting, ask your doctor.  Do not use illegal drugs.  Limit how much alcohol you drink. Contact a doctor  if:  Your symptoms do not get better, even after you are treated.  You have more discharge or pain when you pee (urinate).  You have a fever.  You have pain in your belly (abdomen).  You have pain with sex.  Your bleed from your vagina between periods. Summary  This infection happens when too many germs (bacteria) grow in the vagina.  Treating this condition can lower your risk for some infections from sex (STIs).  You should also treat this if you are pregnant. It can cause early (premature) birth.  Do not stop taking or using your antibiotic medicine even if you start to feel better. This information is not intended to replace advice given to you by your health care provider. Make sure you discuss any questions you have with your health care provider. Document Released: 10/14/2007 Document Revised: 09/20/2015 Document Reviewed: 09/20/2015 Elsevier Interactive Patient Education  2017 [employer] Health Maintenance, Female Introduction Adopting a healthy lifestyle and getting preventive care can go a long way to promote health and wellness. Talk with your health care provider about what schedule of regular examinations is right for you. This is a good chance for you to check in with your provider about disease prevention and staying healthy. In between checkups, there are plenty of things you can do on your own. Experts have done a lot of research about which lifestyle changes and preventive measures are most likely to keep   you healthy. Ask your health care provider for more information. Weight and diet Eat a healthy diet  Be sure to include plenty of vegetables, fruits, low-fat dairy products, and lean protein.  Do not eat a lot of foods high in solid fats, added sugars, or salt.  Get regular exercise. This is one of the most important things you can do for your health.  Most adults should exercise for at least 150 minutes each week. The exercise should increase your heart  rate and make you sweat (moderate-intensity exercise).  Most adults should also do strengthening exercises at least twice a week. This is in addition to the moderate-intensity exercise. Maintain a healthy weight  Body mass index (BMI) is a measurement that can be used to identify possible weight problems. It estimates body fat based on height and weight. Your health care provider can help determine your BMI and help you achieve or maintain a healthy weight.  For females 20 years of age and older:  A BMI below 18.5 is considered underweight.  A BMI of 18.5 to 24.9 is normal.  A BMI of 25 to 29.9 is considered overweight.  A BMI of 30 and above is considered obese. Watch levels of cholesterol and blood lipids  You should start having your blood tested for lipids and cholesterol at 46 years of age, then have this test every 5 years.  You may need to have your cholesterol levels checked more often if:  Your lipid or cholesterol levels are high.  You are older than 46 years of age.  You are at high risk for heart disease. Cancer screening Lung Cancer  Lung cancer screening is recommended for adults 55-80 years old who are at high risk for lung cancer because of a history of smoking.  A yearly low-dose CT scan of the lungs is recommended for people who:  Currently smoke.  Have quit within the past 15 years.  Have at least a 30-pack-year history of smoking. A pack year is smoking an average of one pack of cigarettes a day for 1 year.  Yearly screening should continue until it has been 15 years since you quit.  Yearly screening should stop if you develop a health problem that would prevent you from having lung cancer treatment. Breast Cancer  Practice breast self-awareness. This means understanding how your breasts normally appear and feel.  It also means doing regular breast self-exams. Let your health care provider know about any changes, no matter how small.  If you are in  your 20s or 30s, you should have a clinical breast exam (CBE) by a health care provider every 1-3 years as part of a regular health exam.  If you are 40 or older, have a CBE every year. Also consider having a breast X-ray (mammogram) every year.  If you have a family history of breast cancer, talk to your health care provider about genetic screening.  If you are at high risk for breast cancer, talk to your health care provider about having an MRI and a mammogram every year.  Breast cancer gene (BRCA) assessment is recommended for women who have family members with BRCA-related cancers. BRCA-related cancers include:  Breast.  Ovarian.  Tubal.  Peritoneal cancers.  Results of the assessment will determine the need for genetic counseling and BRCA1 and BRCA2 testing. Cervical Cancer  Your health care provider may recommend that you be screened regularly for cancer of the pelvic organs (ovaries, uterus, and vagina). This screening involves a   pelvic examination, including checking for microscopic changes to the surface of your cervix (Pap test). You may be encouraged to have this screening done every 3 years, beginning at age 21.  For women ages 30-65, health care providers may recommend pelvic exams and Pap testing every 3 years, or they may recommend the Pap and pelvic exam, combined with testing for human papilloma virus (HPV), every 5 years. Some types of HPV increase your risk of cervical cancer. Testing for HPV may also be done on women of any age with unclear Pap test results.  Other health care providers may not recommend any screening for nonpregnant women who are considered low risk for pelvic cancer and who do not have symptoms. Ask your health care provider if a screening pelvic exam is right for you.  If you have had past treatment for cervical cancer or a condition that could lead to cancer, you need Pap tests and screening for cancer for at least 20 years after your treatment. If  Pap tests have been discontinued, your risk factors (such as having a new sexual partner) need to be reassessed to determine if screening should resume. Some women have medical problems that increase the chance of getting cervical cancer. In these cases, your health care provider may recommend more frequent screening and Pap tests. Colorectal Cancer  This type of cancer can be detected and often prevented.  Routine colorectal cancer screening usually begins at 46 years of age and continues through 46 years of age.  Your health care provider may recommend screening at an earlier age if you have risk factors for colon cancer.  Your health care provider may also recommend using home test kits to check for hidden blood in the stool.  A small camera at the end of a tube can be used to examine your colon directly (sigmoidoscopy or colonoscopy). This is done to check for the earliest forms of colorectal cancer.  Routine screening usually begins at age 50.  Direct examination of the colon should be repeated every 5-10 years through 46 years of age. However, you may need to be screened more often if early forms of precancerous polyps or small growths are found. Skin Cancer  Check your skin from head to toe regularly.  Tell your health care provider about any new moles or changes in moles, especially if there is a change in a mole's shape or color.  Also tell your health care provider if you have a mole that is larger than the size of a pencil eraser.  Always use sunscreen. Apply sunscreen liberally and repeatedly throughout the day.  Protect yourself by wearing long sleeves, pants, a wide-brimmed hat, and sunglasses whenever you are outside. Heart disease, diabetes, and high blood pressure  High blood pressure causes heart disease and increases the risk of stroke. High blood pressure is more likely to develop in:  People who have blood pressure in the high end of the normal range (130-139/85-89  mm Hg).  People who are overweight or obese.  People who are African American.  If you are 18-39 years of age, have your blood pressure checked every 3-5 years. If you are 40 years of age or older, have your blood pressure checked every year. You should have your blood pressure measured twice-once when you are at a hospital or clinic, and once when you are not at a hospital or clinic. Record the average of the two measurements. To check your blood pressure when you are not at   a hospital or clinic, you can use:  An automated blood pressure machine at a pharmacy.  A home blood pressure monitor.  If you are between 55 years and 79 years old, ask your health care provider if you should take aspirin to prevent strokes.  Have regular diabetes screenings. This involves taking a blood sample to check your fasting blood sugar level.  If you are at a normal weight and have a low risk for diabetes, have this test once every three years after 45 years of age.  If you are overweight and have a high risk for diabetes, consider being tested at a younger age or more often. Preventing infection Hepatitis B  If you have a higher risk for hepatitis B, you should be screened for this virus. You are considered at high risk for hepatitis B if:  You were born in a country where hepatitis B is common. Ask your health care provider which countries are considered high risk.  Your parents were born in a high-risk country, and you have not been immunized against hepatitis B (hepatitis B vaccine).  You have HIV or AIDS.  You use needles to inject street drugs.  You live with someone who has hepatitis B.  You have had sex with someone who has hepatitis B.  You get hemodialysis treatment.  You take certain medicines for conditions, including cancer, organ transplantation, and autoimmune conditions. Hepatitis C  Blood testing is recommended for:  Everyone born from 1945 through 1965.  Anyone with known  risk factors for hepatitis C. Sexually transmitted infections (STIs)  You should be screened for sexually transmitted infections (STIs) including gonorrhea and chlamydia if:  You are sexually active and are younger than 46 years of age.  You are older than 46 years of age and your health care provider tells you that you are at risk for this type of infection.  Your sexual activity has changed since you were last screened and you are at an increased risk for chlamydia or gonorrhea. Ask your health care provider if you are at risk.  If you do not have HIV, but are at risk, it may be recommended that you take a prescription medicine daily to prevent HIV infection. This is called pre-exposure prophylaxis (PrEP). You are considered at risk if:  You are sexually active and do not regularly use condoms or know the HIV status of your partner(s).  You take drugs by injection.  You are sexually active with a partner who has HIV. Talk with your health care provider about whether you are at high risk of being infected with HIV. If you choose to begin PrEP, you should first be tested for HIV. You should then be tested every 3 months for as long as you are taking PrEP. Pregnancy  If you are premenopausal and you may become pregnant, ask your health care provider about preconception counseling.  If you may become pregnant, take 400 to 800 micrograms (mcg) of folic acid every day.  If you want to prevent pregnancy, talk to your health care provider about birth control (contraception). Osteoporosis and menopause  Osteoporosis is a disease in which the bones lose minerals and strength with aging. This can result in serious bone fractures. Your risk for osteoporosis can be identified using a bone density scan.  If you are 65 years of age or older, or if you are at risk for osteoporosis and fractures, ask your health care provider if you should be screened.  Ask your   health care provider whether you  should take a calcium or vitamin D supplement to lower your risk for osteoporosis.  Menopause may have certain physical symptoms and risks.  Hormone replacement therapy may reduce some of these symptoms and risks. Talk to your health care provider about whether hormone replacement therapy is right for you. Follow these instructions at home:  Schedule regular health, dental, and eye exams.  Stay current with your immunizations.  Do not use any tobacco products including cigarettes, chewing tobacco, or electronic cigarettes.  If you are pregnant, do not drink alcohol.  If you are breastfeeding, limit how much and how often you drink alcohol.  Limit alcohol intake to no more than 1 drink per day for nonpregnant women. One drink equals 12 ounces of beer, 5 ounces of wine, or 1 ounces of hard liquor.  Do not use street drugs.  Do not share needles.  Ask your health care provider for help if you need support or information about quitting drugs.  Tell your health care provider if you often feel depressed.  Tell your health care provider if you have ever been abused or do not feel safe at home. This information is not intended to replace advice given to you by your health care provider. Make sure you discuss any questions you have with your health care provider. Document Released: 07/20/2010 Document Revised: 06/12/2015 Document Reviewed: 10/08/2014  2017 Elsevier  

## 2015-12-26 LAB — WET PREP FOR TRICH, YEAST, CLUE
Trich, Wet Prep: NONE SEEN
WBC, Wet Prep HPF POC: NONE SEEN
Yeast Wet Prep HPF POC: NONE SEEN

## 2015-12-26 NOTE — Addendum Note (Signed)
Addended by: Burnett Kanaris on: 12/26/2015 08:25 AM   Modules accepted: Orders

## 2016-06-02 ENCOUNTER — Encounter: Payer: Self-pay | Admitting: Gynecology

## 2018-11-10 ENCOUNTER — Other Ambulatory Visit: Payer: Self-pay

## 2018-11-13 ENCOUNTER — Encounter: Payer: Self-pay | Admitting: Women's Health

## 2018-11-13 ENCOUNTER — Ambulatory Visit (INDEPENDENT_AMBULATORY_CARE_PROVIDER_SITE_OTHER): Payer: 59 | Admitting: Women's Health

## 2018-11-13 ENCOUNTER — Other Ambulatory Visit: Payer: Self-pay

## 2018-11-13 VITALS — BP 130/80

## 2018-11-13 DIAGNOSIS — N912 Amenorrhea, unspecified: Secondary | ICD-10-CM | POA: Diagnosis not present

## 2018-11-13 DIAGNOSIS — Z01419 Encounter for gynecological examination (general) (routine) without abnormal findings: Secondary | ICD-10-CM | POA: Diagnosis not present

## 2018-11-13 DIAGNOSIS — N898 Other specified noninflammatory disorders of vagina: Secondary | ICD-10-CM

## 2018-11-13 LAB — PREGNANCY, URINE: Preg Test, Ur: NEGATIVE

## 2018-11-13 LAB — WET PREP FOR TRICH, YEAST, CLUE

## 2018-11-13 MED ORDER — TINIDAZOLE 500 MG PO TABS: ORAL_TABLET | ORAL | 0 refills | Status: DC

## 2018-11-13 MED ORDER — NYSTATIN-TRIAMCINOLONE 100000-0.1 UNIT/GM-% EX OINT: 1.0000 "application " | TOPICAL_OINTMENT | Freq: Two times a day (BID) | CUTANEOUS | 1 refills | Status: DC

## 2018-11-13 NOTE — Progress Notes (Signed)
Gabriella Lutz 06-26-1970 [id number]    History:    Presents for annual exam.  10/2011 Mirena IUD rare spotting.  Has not had a screening mammogram.  Last annual here 2017.  Has seen primary care.  Normal Pap history.  Same partner greater than 4 years  Past medical history, past surgical history, family history and social history were all reviewed and documented in the EPIC chart.  Works at [employer].  ROS:  A ROS was performed and pertinent positives and negatives are included.  Exam:  Vitals:   11/13/18 1225  BP: 130/80   There is no height or weight on file to calculate BMI.   General appearance:  Normal Thyroid:  Symmetrical, normal in size, without palpable masses or nodularity. Respiratory  Auscultation:  Clear without wheezing or rhonchi Cardiovascular  Auscultation:  Regular rate, without rubs, murmurs or gallops  Edema/varicosities:  Not grossly evident Abdominal  Soft,nontender, without masses, guarding or rebound.  Liver/spleen:  No organomegaly noted  Hernia:  None appreciated  Skin  Inspection:  Grossly normal   Breasts: Examined lying and sitting. Heat rash appearing irritation under both breasts    Right: Without masses, retractions, discharge or axillary adenopathy.     Left: Without masses, retractions, discharge or axillary adenopathy. Gentitourinary   Inguinal/mons:  Normal without inguinal adenopathy  External genitalia:  Normal  BUS/Urethra/Skene's glands:  Normal  Vagina:  Normal pink discharge with odor wet prep positive for clues, TNTC bacteria  Cervix:  Normal IUD strings visible  Uterus:   normal in size, shape and contour.  Midline and mobile  Adnexa/parametria:     Rt: Without masses or tenderness.   Lt: Without masses or tenderness.  Anus and perineum: Normal  Digital rectal exam: Normal sphincter tone without palpated masses or tenderness  Assessment/Plan:  48 y.o. DBF G2 P2 for annual exam with complaint of vaginal discharge and  irritation under breasts bilaterally.  10/2011 Mirena IUD/negative UPT today Bacterial vaginosis [facility]-primary care  Plan: Options for contraception reviewed would like to repeat Mirena IUD.  Reviewed Mirena IUD is good for 5 years, abstain or use condoms until removed and replaced instructed to schedule today.  Tindamax 2 g today, repeat tomorrow, requested shorter medication treatment.  Alcohol precautions reviewed.  Instructed to call if no relief of discharge.  SBEs, reviewed importance of annual screening mammogram, breast center information given instructed to schedule.  Mycolog ointment after showers under breasts, instructed to keep clean and dry, call if no relief of symptoms.  Vitamin D 1000 daily, decrease calorie/carbs and increase exercise encouraged.  Pap with HR HPV typing, new screening guidelines reviewed.  Pap normal 2017, last office visit here 2017.  Reviewed importance of annual exams.    [place], 1:13 PM 11/13/2018

## 2018-11-13 NOTE — Patient Instructions (Signed)
[facility] [phone]  Health Maintenance, Female Adopting a healthy lifestyle and getting preventive care are important in promoting health and wellness. Ask your health care provider about:  The right schedule for you to have regular tests and exams.  Things you can do on your own to prevent diseases and keep yourself healthy. What should I know about diet, weight, and exercise? Eat a healthy diet   Eat a diet that includes plenty of vegetables, fruits, low-fat dairy products, and lean protein.  Do not eat a lot of foods that are high in solid fats, added sugars, or sodium. Maintain a healthy weight Body mass index (BMI) is used to identify weight problems. It estimates body fat based on height and weight. Your health care provider can help determine your BMI and help you achieve or maintain a healthy weight. Get regular exercise Get regular exercise. This is one of the most important things you can do for your health. Most adults should:  Exercise for at least 150 minutes each week. The exercise should increase your heart rate and make you sweat (moderate-intensity exercise).  Do strengthening exercises at least twice a week. This is in addition to the moderate-intensity exercise.  Spend less time sitting. Even light physical activity can be beneficial. Watch cholesterol and blood lipids Have your blood tested for lipids and cholesterol at 49 years of age, then have this test every 5 years. Have your cholesterol levels checked more often if:  Your lipid or cholesterol levels are high.  You are older than 49 years of age.  You are at high risk for heart disease. What should I know about cancer screening? Depending on your health history and family history, you may need to have cancer screening at various ages. This may include screening for:  Breast cancer.  Cervical cancer.  Colorectal cancer.  Skin cancer.  Lung cancer. What should I know about heart disease,  diabetes, and high blood pressure? Blood pressure and heart disease  High blood pressure causes heart disease and increases the risk of stroke. This is more likely to develop in people who have high blood pressure readings, are of African descent, or are overweight.  Have your blood pressure checked: ? Every 3-5 years if you are 18-39 years of age. ? Every year if you are 40 years old or older. Diabetes Have regular diabetes screenings. This checks your fasting blood sugar level. Have the screening done:  Once every three years after age 40 if you are at a normal weight and have a low risk for diabetes.  More often and at a younger age if you are overweight or have a high risk for diabetes. What should I know about preventing infection? Hepatitis B If you have a higher risk for hepatitis B, you should be screened for this virus. Talk with your health care provider to find out if you are at risk for hepatitis B infection. Hepatitis C Testing is recommended for:  Everyone born from 1945 through 1965.  Anyone with known risk factors for hepatitis C. Sexually transmitted infections (STIs)  Get screened for STIs, including gonorrhea and chlamydia, if: ? You are sexually active and are younger than 49 years of age. ? You are older than 49 years of age and your health care provider tells you that you are at risk for this type of infection. ? Your sexual activity has changed since you were last screened, and you are at increased risk for chlamydia or gonorrhea. Ask your   health care provider if you are at risk.  Ask your health care provider about whether you are at high risk for HIV. Your health care provider may recommend a prescription medicine to help prevent HIV infection. If you choose to take medicine to prevent HIV, you should first get tested for HIV. You should then be tested every 3 months for as long as you are taking the medicine. Pregnancy  If you are about to stop having your  period (premenopausal) and you may become pregnant, seek counseling before you get pregnant.  Take 400 to 800 micrograms (mcg) of folic acid every day if you become pregnant.  Ask for birth control (contraception) if you want to prevent pregnancy. Osteoporosis and menopause Osteoporosis is a disease in which the bones lose minerals and strength with aging. This can result in bone fractures. If you are 65 years old or older, or if you are at risk for osteoporosis and fractures, ask your health care provider if you should:  Be screened for bone loss.  Take a calcium or vitamin D supplement to lower your risk of fractures.  Be given hormone replacement therapy (HRT) to treat symptoms of menopause. Follow these instructions at home: Lifestyle  Do not use any products that contain nicotine or tobacco, such as cigarettes, e-cigarettes, and chewing tobacco. If you need help quitting, ask your health care provider.  Do not use street drugs.  Do not share needles.  Ask your health care provider for help if you need support or information about quitting drugs. Alcohol use  Do not drink alcohol if: ? Your health care provider tells you not to drink. ? You are pregnant, may be pregnant, or are planning to become pregnant.  If you drink alcohol: ? Limit how much you use to 0-1 drink a day. ? Limit intake if you are breastfeeding.  Be aware of how much alcohol is in your drink. In the [place] one drink equals one 12 oz bottle of beer (355 mL), one 5 oz glass of wine (148 mL), or one 1 oz glass of hard liquor (44 mL). General instructions  Schedule regular health, dental, and eye exams.  Stay current with your vaccines.  Tell your health care provider if: ? You often feel depressed. ? You have ever been abused or do not feel safe at home. Summary  Adopting a healthy lifestyle and getting preventive care are important in promoting health and wellness.  Follow your health care provider's  instructions about healthy diet, exercising, and getting tested or screened for diseases.  Follow your health care provider's instructions on monitoring your cholesterol and blood pressure. This information is not intended to replace advice given to you by your health care provider. Make sure you discuss any questions you have with your health care provider. Document Released: 07/20/2010 Document Revised: 12/28/2017 Document Reviewed: 12/28/2017 Elsevier Patient Education  2020 Elsevier Inc.  

## 2018-11-16 LAB — PAP IG W/ RFLX HPV ASCU

## 2018-11-16 LAB — HUMAN PAPILLOMAVIRUS, HIGH RISK: HPV DNA High Risk: NOT DETECTED

## 2018-11-23 ENCOUNTER — Other Ambulatory Visit: Payer: Self-pay

## 2018-11-23 DIAGNOSIS — Z20822 Contact with and (suspected) exposure to covid-19: Secondary | ICD-10-CM

## 2018-11-25 LAB — NOVEL CORONAVIRUS, NAA: SARS-CoV-2, NAA: NOT DETECTED

## 2018-11-29 ENCOUNTER — Other Ambulatory Visit: Payer: Self-pay

## 2018-11-30 ENCOUNTER — Encounter: Payer: Self-pay | Admitting: Gynecology

## 2018-11-30 ENCOUNTER — Ambulatory Visit (INDEPENDENT_AMBULATORY_CARE_PROVIDER_SITE_OTHER): Payer: 59 | Admitting: Gynecology

## 2018-11-30 VITALS — BP 126/82

## 2018-11-30 DIAGNOSIS — N898 Other specified noninflammatory disorders of vagina: Secondary | ICD-10-CM | POA: Diagnosis not present

## 2018-11-30 DIAGNOSIS — N760 Acute vaginitis: Secondary | ICD-10-CM

## 2018-11-30 DIAGNOSIS — B9689 Other specified bacterial agents as the cause of diseases classified elsewhere: Secondary | ICD-10-CM

## 2018-11-30 LAB — WET PREP FOR TRICH, YEAST, CLUE

## 2018-11-30 MED ORDER — CLINDAMYCIN PHOSPHATE 2 % VA CREA: 1.0000 | TOPICAL_CREAM | Freq: Every day | VAGINAL | 0 refills | Status: DC

## 2018-11-30 NOTE — Progress Notes (Signed)
    Gabriella Lutz Dec 26, 1970 [id number]        48 y.o.  [id number] presents initially for Mirena IUD replacement as her current Mirena IUD is way overdue being replaced.  Unfortunately she has had intercourse within the past 2 weeks.  She is not having regular menses but having some intermittent spotting.  She is going to return after abstaining from intercourse over 2 weeks allowing for pregnancy test verification before putting in the new IUD.  The patient does note ongoing vaginal irritation with discharge and odor.  Was treated by Izora Gala with Tindamax 11/13/2018 and does not feel that her symptoms have resolved.  Past medical history,surgical history, problem list, medications, allergies, family history and social history were all reviewed and documented in the EPIC chart.  Directed ROS with pertinent positives and negatives documented in the history of present illness/assessment and plan.  Exam: Caryn Bee assistant Vitals:   11/30/18 1533  BP: 126/82   General appearance:  Normal Abdomen soft nontender without mass guarding rebound Pelvic external BUS vagina with frothy white discharge.  Cervix normal.  IUD string visualized.  Uterus grossly normal midline mobile nontender.  Adnexa without masses or tenderness.  Assessment/Plan:  48 y.o. [id number] with history and exam as above.  Wet prep consistent with bacterial vaginosis.  Options for treatment reviewed.  Patient elects for Cleocin vaginal cream nightly x7 nights.  We will follow-up for IUD as arranged.  We will follow-up if her discharge/irritation continues.    Anastasio Auerbach MD, 3:42 PM 11/30/2018

## 2018-11-30 NOTE — Patient Instructions (Signed)
Use the vaginal cream nightly for 7 nights.  Follow-up for the IUD as you have arranged.

## 2018-12-19 ENCOUNTER — Encounter: Payer: Self-pay | Admitting: Gynecology

## 2018-12-19 ENCOUNTER — Ambulatory Visit (INDEPENDENT_AMBULATORY_CARE_PROVIDER_SITE_OTHER): Payer: 59 | Admitting: Gynecology

## 2018-12-19 ENCOUNTER — Other Ambulatory Visit: Payer: Self-pay

## 2018-12-19 VITALS — BP 124/82

## 2018-12-19 DIAGNOSIS — N912 Amenorrhea, unspecified: Secondary | ICD-10-CM | POA: Diagnosis not present

## 2018-12-19 DIAGNOSIS — Z30433 Encounter for removal and reinsertion of intrauterine contraceptive device: Secondary | ICD-10-CM

## 2018-12-19 LAB — PREGNANCY, URINE: Preg Test, Ur: NEGATIVE

## 2018-12-19 NOTE — Patient Instructions (Signed)

## 2018-12-19 NOTE — Progress Notes (Signed)
    Gabriella Lutz [birth year] [id number]        48 y.o.  G2P2002  presents for Mirena IUD replacement. She has read through the booklet, has no contraindications and signed the consent form.   She was due to have the IUD replaced 2018.  Has remained amenorrheic.  Has abstain for intercourse over 3 weeks and has a negative UPT today.  I reviewed the removal and insertional process with her as well as the risks to include infection, either immediate or long-term, uterine perforation or migration requiring surgery to remove, other complications such as pain, hormonal side effects  and possibility of failure with subsequent pregnancy.   Exam with Caryn Bee assistant Vitals:   12/19/18 1529  BP: 124/82    Pelvic: External BUS vagina normal. Cervix normal with IUD string visualized. Uterus anteverted normal size shape contour midline mobile nontender. Adnexa without masses or tenderness.  Procedure: The cervix was visualized with a speculum, the old IUD string grasped with a [place] forcep and the old Mirena IUD was removed, shown to the patient and discarded.  The cervix was then cleansed with Betadine, anterior lip grasped with a single-tooth tenaculum, the uterus was sounded and a Mirena IUD was placed according to manufacturer's recommendations without difficulty. The strings were trimmed. The patient tolerated well and will follow up in one month for a postinsertional check.  Lot number:  [id number]    Gabriella Auerbach MD, 4:01 PM 12/19/2018

## 2019-01-24 ENCOUNTER — Ambulatory Visit: Payer: 59 | Admitting: Women's Health

## 2019-01-29 ENCOUNTER — Other Ambulatory Visit: Payer: Self-pay

## 2019-01-30 ENCOUNTER — Encounter: Payer: Self-pay | Admitting: Women's Health

## 2019-01-30 ENCOUNTER — Ambulatory Visit: Payer: 59 | Admitting: Women's Health

## 2019-01-30 VITALS — BP 126/84

## 2019-01-30 DIAGNOSIS — N898 Other specified noninflammatory disorders of vagina: Secondary | ICD-10-CM | POA: Diagnosis not present

## 2019-01-30 LAB — WET PREP FOR TRICH, YEAST, CLUE

## 2019-01-30 MED ORDER — METRONIDAZOLE 500 MG PO TABS: 500.0000 mg | ORAL_TABLET | Freq: Two times a day (BID) | ORAL | 0 refills | Status: DC

## 2019-01-30 NOTE — Progress Notes (Signed)
49 year old SBF G2 P2 presents for IUD check.  01/07/2019 Mirena IUD removed and replaced.  Amenorrheic.  Having increased vaginal discharge.  Same partner with negative STD screen.  Medical problems include obesity,  on no medication.  Primary care manages labs reports as normal.  Father recently diagnosed with pancreatic cancer.  Exam: Appears well.  Abdomen soft, obese, external genitalia within normal limits, speculum exam scant white discharge, IUD strings visible appropriate length.  Wet prep positive for clues, TNTC bacteria.  Bimanual no tenderness with exam.  IUD follow-up Bacterial vaginosis  Plan: Flagyl 500 twice daily for 7 days, alcohol precautions reviewed.  Alcohol precautions reviewed.  Instructed to call if continued problems with discharge.  Aware IUD is good for 5 years.  Reviewed importance of increasing exercise and decreasing calories/carbs, weight watchers encouraged.

## 2019-01-30 NOTE — Patient Instructions (Signed)
Bariatric Surgery Information Bariatric surgery, also called weight loss surgery, is a procedure that helps you lose weight. You may consider, or your health care provider may suggest, bariatric surgery if:  You are severely obese and have been unable to lose weight through diet and exercise.  You have health problems related to obesity, such as: ? Type 2 diabetes. ? Heart disease. ? Lung disease. How does bariatric surgery help me lose weight? Bariatric surgery helps you lose weight by:  Decreasing how much food your body absorbs. This is done by closing off part of your stomach to make it smaller. This restricts the amount of food your stomach can hold.  Changing your body's regular digestive process so that food bypasses the parts of your body that absorb calories and nutrients. If you decide to have bariatric surgery, it is important to continue to eat a healthy diet and exercise regularly after the surgery. What are the different kinds of bariatric surgery? There are two kinds of bariatric surgeries:  Restrictive surgery. This procedure makes your stomach smaller. It does not change your digestive process. The smaller the size of your new stomach, the less food you can eat. There are different types of restrictive surgeries.  Malabsorptive surgery. This procedure makes your stomach smaller and alters your digestive process so that your body processes less calories and nutrients. These are the most common kind of bariatric surgery. There are different types of malabsorptive surgeries. What are the different types of restrictive surgery? Adjustable Gastric Banding In this procedure, an inflatable band is placed around your stomach near the upper end. This makes the passageway for food into the rest of your stomach much smaller. The band can be adjusted, making it tighter or looser, by filling it with salt solution. Your surgeon can adjust the band based on how you are feeling and how much  weight you are losing. The band can be removed in the future. This requires another surgery. Sleeve Gastrectomy In this procedure, your stomach is made smaller. This is done by surgically removing a large part of your stomach. When your stomach is smaller, you feel full more quickly and reduce how much you eat. What are the different types of malabsorptive surgery?  Roux-en-Y Gastric Bypass (RGB) This is the most common weight loss surgery. In this procedure, a small stomach pouch (gastric pouch) is created in the upper part of your stomach. Next, this gastric pouch is attached directly to the middle part of your small intestine. The farther down your small intestine the new connection is made, the fewer calories and nutrients you will absorb. This surgery has the highest rate of complications. Biliopancreatic Diversion with Duodenal Switch (BPD/DS) This is a multi-step procedure. First, a large part of your stomach is removed, making your stomach smaller. Next, this smaller stomach is attached to the lower part of your small intestine. Like the RGB surgery, you absorb fewer calories and nutrients the farther down your small intestine the attachment is made. What are the risks of bariatric surgery? As with any surgical procedure, each type of bariatric surgery has its own risks. These risks also depend on your age, your overall health, and any other medical conditions you may have. When deciding on bariatric surgery, it is very important to:  Talk to your health care provider and choose the surgery that is best for you.  Ask your health care provider about specific risks for the surgery you choose. Generally, the risks of bariatric surgery include:  Infection.  Bleeding.  Not getting enough nutrients from food (nutritional deficiencies).  Failure of the device or procedure. This may require another surgery to correct the problem. Where to find more information  American Society for  Metabolic & Bariatric Surgery: www.asmbs.org  Weight-control Information Network (WIN): win.AmenCredit.is Summary  Bariatric surgery, also called weight loss surgery, is a procedure that helps you lose weight.  This surgery may be recommended if you have diabetes, heart disease, or lung disease.  Generally, risks of bariatric surgery include infection, bleeding, and failure of the surgery or device, which may require another surgery to correct the problem. This information is not intended to replace advice given to you by your health care provider. Make sure you discuss any questions you have with your health care provider. Document Revised: 04/25/2018 Document Reviewed: 02/09/2016 Elsevier Patient Education  [place]. Bacterial Vaginosis  Bacterial vaginosis is a vaginal infection that occurs when the normal balance of bacteria in the vagina is disrupted. It results from an overgrowth of certain bacteria. This is the most common vaginal infection among women ages 104-44. Because bacterial vaginosis increases your risk for STIs (sexually transmitted infections), getting treated can help reduce your risk for chlamydia, gonorrhea, herpes, and HIV (human immunodeficiency virus). Treatment is also important for preventing complications in pregnant women, because this condition can cause an early (premature) delivery. What are the causes? This condition is caused by an increase in harmful bacteria that are normally present in small amounts in the vagina. However, the reason that the condition develops is not fully understood. What increases the risk? The following factors may make you more likely to develop this condition:  Having a new sexual partner or multiple sexual partners.  Having unprotected sex.  Douching.  Having an intrauterine device (IUD).  Smoking.  Drug and alcohol abuse.  Taking certain antibiotic medicines.  Being pregnant. You cannot get bacterial vaginosis  from toilet seats, bedding, swimming pools, or contact with objects around you. What are the signs or symptoms? Symptoms of this condition include:  Grey or white vaginal discharge. The discharge can also be watery or foamy.  A fish-like odor with discharge, especially after sexual intercourse or during menstruation.  Itching in and around the vagina.  Burning or pain with urination. Some women with bacterial vaginosis have no signs or symptoms. How is this diagnosed? This condition is diagnosed based on:  Your medical history.  A physical exam of the vagina.  Testing a sample of vaginal fluid under a microscope to look for a large amount of bad bacteria or abnormal cells. Your health care provider may use a cotton swab or a small wooden spatula to collect the sample. How is this treated? This condition is treated with antibiotics. These may be given as a pill, a vaginal cream, or a medicine that is put into the vagina (suppository). If the condition comes back after treatment, a second round of antibiotics may be needed. Follow these instructions at home: Medicines  Take over-the-counter and prescription medicines only as told by your health care provider.  Take or use your antibiotic as told by your health care provider. Do not stop taking or using the antibiotic even if you start to feel better. General instructions  If you have a female sexual partner, tell her that you have a vaginal infection. She should see her health care provider and be treated if she has symptoms. If you have a female sexual partner, he does not need treatment.  During treatment: ? Avoid sexual activity until you finish treatment. ? Do not douche. ? Avoid alcohol as directed by your health care provider. ? Avoid breastfeeding as directed by your health care provider.  Drink enough water and fluids to keep your urine clear or pale yellow.  Keep the area around your vagina and rectum clean. ? Wash the  area daily with warm water. ? Wipe yourself from front to back after using the toilet.  Keep all follow-up visits as told by your health care provider. This is important. How is this prevented?  Do not douche.  Wash the outside of your vagina with warm water only.  Use protection when having sex. This includes latex condoms and dental dams.  Limit how many sexual partners you have. To help prevent bacterial vaginosis, it is best to have sex with just one partner (monogamous).  Make sure you and your sexual partner are tested for STIs.  Wear cotton or cotton-lined underwear.  Avoid wearing tight pants and pantyhose, especially during summer.  Limit the amount of alcohol that you drink.  Do not use any products that contain nicotine or tobacco, such as cigarettes and e-cigarettes. If you need help quitting, ask your health care provider.  Do not use illegal drugs. Where to find more information  Centers for Disease Control and Prevention: AppraiserFraud.fi  American Sexual Health Association (ASHA): www.ashastd.org  [place].S. Department of Health and Financial controller, Office on Women's Health: DustingSprays.pl or SecuritiesCard.it Contact a health care provider if:  Your symptoms do not improve, even after treatment.  You have more discharge or pain when urinating.  You have a fever.  You have pain in your abdomen.  You have pain during sex.  You have vaginal bleeding between periods. Summary  Bacterial vaginosis is a vaginal infection that occurs when the normal balance of bacteria in the vagina is disrupted.  Because bacterial vaginosis increases your risk for STIs (sexually transmitted infections), getting treated can help reduce your risk for chlamydia, gonorrhea, herpes, and HIV (human immunodeficiency virus). Treatment is also important for preventing complications in pregnant women, because the condition can cause an early  (premature) delivery.  This condition is treated with antibiotic medicines. These may be given as a pill, a vaginal cream, or a medicine that is put into the vagina (suppository). This information is not intended to replace advice given to you by your health care provider. Make sure you discuss any questions you have with your health care provider. Document Revised: 12/17/2016 Document Reviewed: 09/20/2015 Elsevier Patient Education  2020 [employer].

## 2019-10-31 ENCOUNTER — Encounter: Payer: Self-pay | Admitting: Obstetrics & Gynecology

## 2019-10-31 ENCOUNTER — Other Ambulatory Visit: Payer: Self-pay

## 2019-10-31 ENCOUNTER — Ambulatory Visit (INDEPENDENT_AMBULATORY_CARE_PROVIDER_SITE_OTHER): Payer: 59 | Admitting: Obstetrics & Gynecology

## 2019-10-31 VITALS — BP 128/86

## 2019-10-31 DIAGNOSIS — Z30431 Encounter for routine checking of intrauterine contraceptive device: Secondary | ICD-10-CM | POA: Diagnosis not present

## 2019-10-31 DIAGNOSIS — N898 Other specified noninflammatory disorders of vagina: Secondary | ICD-10-CM

## 2019-10-31 LAB — WET PREP FOR TRICH, YEAST, CLUE

## 2019-10-31 MED ORDER — TERCONAZOLE 0.8 % VA CREA: 1.0000 | TOPICAL_CREAM | Freq: Every day | VAGINAL | 3 refills | Status: AC

## 2019-10-31 NOTE — Progress Notes (Signed)
    Gabriella Lutz 09/10/1970 [id number]        49 y.o.  G2P2002 Stable partner  RP: Vaginal/vulvar irritation x 4 days  HPI: C/O vaginal/vulvar irritation x 4 days.  Mild vaginal d/c.  No odor.  No pelvic pain.  No UTI Sx.  No fever.  Declines STI screen.  Well on Mirena IUD x 12/2018 with very light menses monthly, not currently bleeding.   OB History  Gravida Para Term Preterm AB Living  2 2 2     2   SAB TAB Ectopic Multiple Live Births          2    # Outcome Date GA Lbr Len/2nd Weight Sex Delivery Anes PTL Lv  2 Term     F Vag-Spont  N LIV  1 Term     F Vag-Spont  N LIV    Past medical history,surgical history, problem list, medications, allergies, family history and social history were all reviewed and documented in the EPIC chart.   Directed ROS with pertinent positives and negatives documented in the history of present illness/assessment and plan.  Exam:  Vitals:   10/31/19 1541  BP: 128/86   General appearance:  Normal  Abdomen: Normal  Gynecologic exam: Vulva normal, no erythema, no lesion.  Speculum:  Cervix normal, IUD strings short, but visible at EO.  Vagina normal.  Mild increase in vaginal d/c.  Wet prep done.  Wet prep Yeasts present   Assessment/Plan:  49 y.o. G2P2002   1. Vaginal irritation Yeast vaginitis confirmed by wet prep.  Decision to treat with terconazole 0.8% vaginal cream, 1 applicator vaginally at bedtime for 3 days.  Usage reviewed and prescription sent to pharmacy.  Refills x3 sent. - WET PREP FOR [place], YEAST, CLUE  2. IUD check up Well on Mirena IUD.  IUD in good position.  Other orders - terconazole (TERAZOL 3) 0.8 % vaginal cream; Place 1 applicator vaginally at bedtime for 3 days.  Princess Bruins MD, 3:46 PM 10/31/2019

## 2021-01-05 ENCOUNTER — Ambulatory Visit (INDEPENDENT_AMBULATORY_CARE_PROVIDER_SITE_OTHER): Payer: 59 | Admitting: Nurse Practitioner

## 2021-01-05 ENCOUNTER — Encounter: Payer: Self-pay | Admitting: Nurse Practitioner

## 2021-01-05 ENCOUNTER — Other Ambulatory Visit: Payer: Self-pay

## 2021-01-05 VITALS — BP 134/80

## 2021-01-05 DIAGNOSIS — B9689 Other specified bacterial agents as the cause of diseases classified elsewhere: Secondary | ICD-10-CM

## 2021-01-05 DIAGNOSIS — N760 Acute vaginitis: Secondary | ICD-10-CM

## 2021-01-05 DIAGNOSIS — N898 Other specified noninflammatory disorders of vagina: Secondary | ICD-10-CM

## 2021-01-05 LAB — WET PREP FOR TRICH, YEAST, CLUE

## 2021-01-05 MED ORDER — METRONIDAZOLE 0.75 % VA GEL: 1.0000 | Freq: Every day | VAGINAL | 0 refills | Status: AC

## 2021-01-05 NOTE — Progress Notes (Signed)
° °  Acute Office Visit  Subjective:    Patient ID: Gabriella Lutz, female    DOB: 13-Jul-1970, 50 y.o.   MRN: [id number]   HPI 50 y.o. presents today for vaginal irritation x 2 weeks. She used OTC monistat-7 and felt some improvement but symptoms returned worse than before. Not sexually active.    Review of Systems  Constitutional: Negative.   Genitourinary:  Positive for vaginal pain (Irritation). Negative for genital sores and vaginal discharge.      Objective:    Physical Exam Constitutional:      Appearance: Normal appearance.  Genitourinary:    General: Normal vulva.     Vagina: Vaginal discharge present. No erythema.     Cervix: Normal.    BP 134/80 (Cuff Size: Large)  Wt Readings from Last 3 Encounters:  12/25/15 268 lb (121.6 kg)  12/21/11 258 lb (117 kg)  11/04/11 255 lb 9.6 oz (115.9 kg)   Wet prep + clue cells + odor     Assessment & Plan:   Problem List Items Addressed This Visit   None Visit Diagnoses     Bacterial vaginosis    -  Primary   Relevant Medications   metroNIDAZOLE (METROGEL) 0.75 % vaginal gel   Vaginal irritation       Relevant Orders   WET PREP FOR TRICH, YEAST, CLUE      Plan: Wet prep positive for clue cells - Metrogel 0.75% nightly x 5 nights. Will return if symptoms worsen or do not improve.      Tamela Gammon DNP, 2:37 PM 01/05/2021
